# Patient Record
Sex: Male | Born: 1970 | Race: White | Hispanic: No | Smoking: Never smoker
Health system: Southern US, Community
[De-identification: ages and names within clinical notes are randomized; demographics above are authoritative.]

## PROBLEM LIST (undated history)

## (undated) DIAGNOSIS — M1990 Unspecified osteoarthritis, unspecified site: Secondary | ICD-10-CM

## (undated) DIAGNOSIS — E785 Hyperlipidemia, unspecified: Secondary | ICD-10-CM

## (undated) DIAGNOSIS — R519 Headache, unspecified: Secondary | ICD-10-CM

## (undated) DIAGNOSIS — J189 Pneumonia, unspecified organism: Secondary | ICD-10-CM

## (undated) HISTORY — PX: KNEE ARTHROSCOPY: SHX127

## (undated) HISTORY — PX: DG ANKLE LEFT COMPLETE (ARMC HX): HXRAD1441

## (undated) HISTORY — PX: APPENDECTOMY: SHX54

## (undated) HISTORY — PX: HERNIA REPAIR: SHX51

## (undated) HISTORY — PX: KNEE ARTHROPLASTY: SHX992

## (undated) HISTORY — DX: Hyperlipidemia, unspecified: E78.5

## (undated) HISTORY — PX: ANKLE SURGERY: SHX546

---

## 2015-09-26 HISTORY — PX: UMBILICAL HERNIA REPAIR: SUR1181

## 2022-01-09 ENCOUNTER — Emergency Department (HOSPITAL_COMMUNITY): Payer: Self-pay

## 2022-01-09 ENCOUNTER — Encounter (HOSPITAL_COMMUNITY): Payer: Self-pay

## 2022-01-09 ENCOUNTER — Other Ambulatory Visit: Payer: Self-pay

## 2022-01-09 ENCOUNTER — Emergency Department (HOSPITAL_COMMUNITY)
Admission: EM | Admit: 2022-01-09 | Discharge: 2022-01-09 | Discharge status: Against Medical Advice | Payer: Self-pay | Attending: Emergency Medicine | Admitting: Emergency Medicine

## 2022-01-09 DIAGNOSIS — Z5321 Procedure and treatment not carried out due to patient leaving prior to being seen by health care provider: Secondary | ICD-10-CM | POA: Insufficient documentation

## 2022-01-09 DIAGNOSIS — M545 Low back pain, unspecified: Secondary | ICD-10-CM | POA: Insufficient documentation

## 2022-01-09 NOTE — ED Triage Notes (Signed)
Patient states lower back pain with increasing pain over the past 2 weeks. Patient states walking make the pain severe. Patient states he was shoveling and hurt his back.  ?

## 2022-10-18 ENCOUNTER — Ambulatory Visit: Payer: Medicaid Other | Admitting: Nurse Practitioner

## 2022-11-08 DIAGNOSIS — U071 COVID-19: Secondary | ICD-10-CM | POA: Diagnosis not present

## 2022-11-24 DIAGNOSIS — M545 Low back pain, unspecified: Secondary | ICD-10-CM | POA: Diagnosis not present

## 2022-12-18 DIAGNOSIS — J209 Acute bronchitis, unspecified: Secondary | ICD-10-CM | POA: Diagnosis not present

## 2022-12-27 ENCOUNTER — Ambulatory Visit: Payer: Medicaid Other | Admitting: Family Medicine

## 2022-12-28 ENCOUNTER — Ambulatory Visit (INDEPENDENT_AMBULATORY_CARE_PROVIDER_SITE_OTHER): Payer: Medicaid Other | Admitting: Family Medicine

## 2022-12-28 ENCOUNTER — Encounter: Payer: Self-pay | Admitting: Family Medicine

## 2022-12-28 ENCOUNTER — Other Ambulatory Visit (INDEPENDENT_AMBULATORY_CARE_PROVIDER_SITE_OTHER): Payer: Medicaid Other

## 2022-12-28 VITALS — BP 136/78 | Ht 73.0 in | Wt 217.0 lb

## 2022-12-28 DIAGNOSIS — R079 Chest pain, unspecified: Secondary | ICD-10-CM | POA: Diagnosis not present

## 2022-12-28 DIAGNOSIS — R030 Elevated blood-pressure reading, without diagnosis of hypertension: Secondary | ICD-10-CM

## 2022-12-28 DIAGNOSIS — Z136 Encounter for screening for cardiovascular disorders: Secondary | ICD-10-CM

## 2022-12-28 DIAGNOSIS — Z833 Family history of diabetes mellitus: Secondary | ICD-10-CM

## 2022-12-28 DIAGNOSIS — Z1331 Encounter for screening for depression: Secondary | ICD-10-CM | POA: Diagnosis not present

## 2022-12-28 DIAGNOSIS — R0683 Snoring: Secondary | ICD-10-CM

## 2022-12-28 DIAGNOSIS — Z96652 Presence of left artificial knee joint: Secondary | ICD-10-CM | POA: Diagnosis not present

## 2022-12-28 NOTE — Progress Notes (Signed)
New Patient Office Visit  Subjective   Patient ID: Greg Mason, male    DOB: [birth year]  Age: 52 y.o. MRN: [id number]  CC:  Chief Complaint  Patient presents with   New Patient (Initial Visit)    Sharp stabbing pain left side of chest, 2 episodes Concern for high cholesterol Concern for high blood sugar   HPI Greg Mason presents to establish care Works in Architect, building houses. [employer]  Main concern today is chest pain  Intermittent that woke him up in the middle of the night  States that first episode was 3 months ago in January. Has only happened twice.  First episode woke up at 5 am, had to sit up and hold his chest.  Second time, watching TV in the evening. Describes it as sharp. Almost called EMS. Denies sweating, shortness of breath with it.  Endorses occasional acid reflux.  Denies edema, palpitations.   Patient is concerned for diabetes. Has family history diabetes and states his sister took his BG and it was >160 and now he is concerned.   Tobacco Abuse  Dips tobacco. Started a couple of years ago.   Has not had health maintenance in several years   Outpatient Encounter Medications as of 12/28/2022  Medication Sig   diphenhydrAMINE (BENADRYL) 25 mg capsule Take by mouth.   No facility-administered encounter medications on file as of 12/28/2022.   No past medical history on file.  No family history on file.  Social History   Socioeconomic History   Marital status: Divorced    Spouse name: Not on file   Number of children: Not on file   Years of education: Not on file   Highest education level: Not on file  Occupational History   Not on file  Tobacco Use   Smoking status: Not on file   Smokeless tobacco: Not on file  Substance and Sexual Activity   Alcohol use: Not on file   Drug use: Not on file   Sexual activity: Not on file  Other Topics Concern   Not on file  Social History Narrative   Not on file   Social  Determinants of Health   Financial Resource Strain: Not on file  Food Insecurity: Not on file  Transportation Needs: Not on file  Physical Activity: Not on file  Stress: Not on file  Social Connections: Not on file  Intimate Partner Violence: Not on file    ROS As per HPI   Objective       12/28/2022    3:21 PM 12/28/2022    3:00 PM 12/28/2022    2:52 PM  Vitals with BMI  Height   6\' 1"   Weight   217 lbs  BMI   [id number]  Systolic [id number]   Diastolic 78 80      Physical Exam Constitutional:      General: He is not in acute distress.    Appearance: Normal appearance. He is not ill-appearing, toxic-appearing or diaphoretic.  Cardiovascular:     Rate and Rhythm: Normal rate.     Pulses: Normal pulses.     Heart sounds: Normal heart sounds. No murmur heard.    No gallop.  Pulmonary:     Effort: Pulmonary effort is normal. No respiratory distress.     Breath sounds: Normal breath sounds. No stridor. No wheezing, rhonchi or rales.  Skin:    General: Skin is warm.     Capillary Refill: Capillary refill takes less than  2 seconds.  Neurological:     General: No focal deficit present.     Mental Status: He is alert and oriented to person, place, and time. Mental status is at baseline.     Motor: No weakness.  Psychiatric:        Mood and Affect: Mood normal.        Behavior: Behavior normal.        Thought Content: Thought content normal.        Judgment: Judgment normal.      12/28/2022    3:35 PM  Depression screen PHQ 2/9  Decreased Interest 0  Down, Depressed, Hopeless 0  PHQ - 2 Score 0  Altered sleeping 0  Tired, decreased energy 0  Change in appetite 0  Feeling bad or failure about yourself  0  Trouble concentrating 0  Moving slowly or fidgety/restless 0  Suicidal thoughts 0  PHQ-9 Score 0  Difficult doing work/chores Not difficult at all      12/28/2022    3:35 PM  GAD 7 : Generalized Anxiety Score  Nervous, Anxious, on Edge 0  Control/stop worrying 0   Worry too much - different things 0  Trouble relaxing 0  Restless 0  Easily annoyed or irritable 0  Afraid - awful might happen 0  Total GAD 7 Score 0  Anxiety Difficulty Not difficult at all   Assessment & Plan:  1. Chest pain, unspecified type EKG reviewed by myself in clinic today. NSR without significant abnormalities. Patient not having active chest pain. Labs as below. Will communicate results to patient once available. Differentials include anemia, OSA, thyroid dysfunction and hypertension  Zio monitor as below to monitor for arrhythmia  - EKG 12-Lead - LONG TERM MONITOR (3-14 DAYS); Future - CBC with Differential/Platelet - CMP14+EGFR - TSH - VITAMIN D 25 Hydroxy (Vit-D Deficiency, Fractures)  2. Snores Referral placed as below to evaluate for OSA. - Ambulatory referral to Sleep Studies  3. Family history of diabetes mellitus type II Labs as above. Will monitor fasting glucose to determine need for A1C.   4. Encounter for screening for cardiovascular disorders Labs as below. Will communicate results to patient once available.  Fasting - Lipid panel  5. Encounter for screening for depression Patient did not screen positive for depression or anxiety today on exam. Discussed with patient that chest pain can be associated with depression/anxiety.   6. History of total left knee replacement Referral placed as below per patient request.  - Ambulatory referral to Orthopedic Surgery  7. Elevated blood pressure reading Elevated BP today in office. Discussed with patient monitoring BP at home. Provided BP log to patient in clinic today. Instructed pt to take BP first thing in the morning after sitting for 5 minutes with feet flat on the floor, arm at heart level. Discussed with patient options for BP cuff at [facility], [facility], [facility]. Pt verbalized they were able to obtain BP cuff. Will review measurements with patient at follow up and determine plan for BP management.    The above assessment and management plan was discussed with the patient. The patient verbalized understanding of and has agreed to the management plan using shared-decision making. Patient is aware to call the clinic if they develop any new symptoms or if symptoms fail to improve or worsen. Patient is aware when to return to the clinic for a follow-up visit. Patient educated on when it is appropriate to go to the emergency department.   Alvie Heidelberg  Jeneen Montgomery, DNP-FNP [facility] [address] [place], [place] [address] [phone]

## 2022-12-29 LAB — CMP14+EGFR
ALT: 26 IU/L (ref 0–44)
AST: 24 IU/L (ref 0–40)
Albumin/Globulin Ratio: 1.6 (ref 1.2–2.2)
Albumin: 4.2 g/dL (ref 3.8–4.9)
Alkaline Phosphatase: 102 IU/L (ref 44–121)
BUN/Creatinine Ratio: 13 (ref 9–20)
BUN: 16 mg/dL (ref 6–24)
Bilirubin Total: 0.6 mg/dL (ref 0.0–1.2)
CO2: 26 mmol/L (ref 20–29)
Calcium: 9.9 mg/dL (ref 8.7–10.2)
Chloride: 102 mmol/L (ref 96–106)
Creatinine, Ser: 1.24 mg/dL (ref 0.76–1.27)
Globulin, Total: 2.7 g/dL (ref 1.5–4.5)
Glucose: 67 mg/dL — ABNORMAL LOW (ref 70–99)
Potassium: 4.6 mmol/L (ref 3.5–5.2)
Sodium: 141 mmol/L (ref 134–144)
Total Protein: 6.9 g/dL (ref 6.0–8.5)
eGFR: 70 mL/min/{1.73_m2} (ref >59)

## 2022-12-29 LAB — CBC WITH DIFFERENTIAL/PLATELET
Basophils Absolute: 0.1 10*3/uL (ref 0.0–0.2)
Basos: 1 %
EOS (ABSOLUTE): 0.1 10*3/uL (ref 0.0–0.4)
Eos: 1 %
Hematocrit: 46.2 % (ref 37.5–51.0)
Hemoglobin: 15.4 g/dL (ref 13.0–17.7)
Immature Grans (Abs): 0 10*3/uL (ref 0.0–0.1)
Immature Granulocytes: 0 %
Lymphocytes Absolute: 2.6 10*3/uL (ref 0.7–3.1)
Lymphs: 26 %
MCH: 29.1 pg (ref 26.6–33.0)
MCHC: 33.3 g/dL (ref 31.5–35.7)
MCV: 87 fL (ref 79–97)
Monocytes Absolute: 0.6 10*3/uL (ref 0.1–0.9)
Monocytes: 6 %
Neutrophils Absolute: 6.5 10*3/uL (ref 1.4–7.0)
Neutrophils: 66 %
Platelets: 311 10*3/uL (ref 150–450)
RBC: 5.29 x10E6/uL (ref 4.14–5.80)
RDW: 13 % (ref 11.6–15.4)
WBC: 9.8 10*3/uL (ref 3.4–10.8)

## 2022-12-29 LAB — LIPID PANEL
Chol/HDL Ratio: 3.4 ratio (ref 0.0–5.0)
Cholesterol, Total: 192 mg/dL (ref 100–199)
HDL: 56 mg/dL (ref >39)
LDL Chol Calc (NIH): 117 mg/dL — ABNORMAL HIGH (ref 0–99)
Triglycerides: 107 mg/dL (ref 0–149)
VLDL Cholesterol Cal: 19 mg/dL (ref 5–40)

## 2022-12-29 LAB — VITAMIN D 25 HYDROXY (VIT D DEFICIENCY, FRACTURES): Vit D, 25-Hydroxy: 46.9 ng/mL (ref 30.0–100.0)

## 2022-12-29 LAB — TSH: TSH: 2.04 u[IU]/mL (ref 0.450–4.500)

## 2023-01-15 ENCOUNTER — Ambulatory Visit: Payer: Medicaid Other | Admitting: Orthopedic Surgery

## 2023-01-19 NOTE — Addendum Note (Signed)
Addended by: Neale Burly on: 01/19/2023 01:51 PM   Modules accepted: Orders

## 2023-01-25 ENCOUNTER — Encounter: Payer: Medicaid Other | Admitting: Family Medicine

## 2023-02-21 ENCOUNTER — Encounter: Payer: Self-pay | Admitting: Internal Medicine

## 2023-02-21 ENCOUNTER — Ambulatory Visit: Payer: Medicaid Other | Attending: Internal Medicine | Admitting: Internal Medicine

## 2023-02-21 VITALS — BP 144/88 | HR 76 | Ht 73.0 in | Wt 223.2 lb

## 2023-02-21 DIAGNOSIS — R0789 Other chest pain: Secondary | ICD-10-CM | POA: Diagnosis not present

## 2023-02-21 NOTE — Patient Instructions (Addendum)
Medication Instructions:   Your physician recommends that you continue on your current medications as directed. Please refer to the Current Medication list given to you today.  Labwork:  none  Testing/Procedures:  none  Follow-Up:  Your physician recommends that you schedule a follow-up appointment in: as needed.  Any Other Special Instructions Will Be Listed Below (If Applicable).  If you need a refill on your cardiac medications before your next appointment, please call your pharmacy. 

## 2023-02-21 NOTE — Progress Notes (Signed)
Cardiology Office Note  Date: 02/21/2023   ID: Greg Mason, DOB 07/09/1971, MRN [id number]  PCP:  Arrie Senate, FNP  Cardiologist:  Marjo Bicker, MD Electrophysiologist:  None   Reason for Office Visit: Chest pain evaluation at the request of [name], FNP   History of Present Illness: Greg Mason is a 52 y.o. male with no PMH was referred to cardiology clinic for evaluation of chest pain.  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 He walks long distances at work and also lifts weights for his side business. Has no symptoms of angina during these extreme strenuous activities. He had 2 episodes of chest pain, first episode occurred in the middle of the night waking him up from sleep. Lasted for 1 minute. He tried to hold his hands close to his chest after which the pain resolved completely. He had a recurrent episode while he was watching TV and lasted for 1 to 2 minutes. These 2 episodes occurred at rest.  Patient eats a lot of spicy foods and does not recall if he ate any spicy food the night before the first episode of chest pain. No family history of premature CAD. Denies DOE, dizziness, syncope, palpitations and leg swelling.    Past Medical History:  Diagnosis Date   Hyperlipidemia     Past Surgical History:  Procedure Laterality Date   APPENDECTOMY     DG ANKLE LEFT COMPLETE ([facility] HX)     HERNIA REPAIR     KNEE ARTHROSCOPY      Current Outpatient Medications  Medication Sig Dispense Refill   diphenhydrAMINE (BENADRYL) 25 mg capsule Take by mouth.     No current facility-administered medications for this visit.   Allergies:  Hydrocodone   Social History: The patient  reports that he has never smoked. His smokeless tobacco use includes snuff. He reports that he does not drink alcohol and does not use drugs.   Family History: The patient's family history includes Diabetes in his mother; Seizures in his sister.    ROS:  Please see the history of present illness. Otherwise, complete review of systems is positive for none  All other systems are reviewed and negative.   Physical Exam: VS:  BP (!) 144/88   Pulse 76   Ht 6\' 1"  (1.854 m)   Wt 223 lb 3.2 oz (101.2 kg)   SpO2 98%   BMI 29.45 kg/m , BMI Body mass index is 29.45 kg/m.  Wt Readings from Last 3 Encounters:  02/21/23 223 lb 3.2 oz (101.2 kg)  12/28/22 217 lb (98.4 kg)  01/09/22 220 lb (99.8 kg)    General: Patient appears comfortable at rest. HEENT: Conjunctiva and lids normal, oropharynx clear with moist mucosa. Neck: Supple, no elevated JVP or carotid bruits, no thyromegaly. Lungs: Clear to auscultation, nonlabored breathing at rest. Cardiac: Regular rate and rhythm, no S3 or significant systolic murmur, no pericardial rub. Abdomen: Soft, nontender, no hepatomegaly, bowel sounds present, no guarding or rebound. Extremities: No pitting edema, distal pulses 2+. Skin: Warm and dry. Musculoskeletal: No kyphosis. Neuropsychiatric: Alert and oriented x3, affect grossly appropriate.  Recent Labwork: 12/28/2022: ALT 26; AST 24; BUN 16; Creatinine, Ser 1.24; Hemoglobin 15.4; Platelets 311; Potassium 4.6; Sodium 141; TSH 2.040     Component Value Date/Time   CHOL 192 12/28/2022 1603   TRIG 107 12/28/2022 1603   HDL 56 12/28/2022 1603   CHOLHDL 3.4 12/28/2022 1603   LDLCALC 117 (H) 12/28/2022 1603  Assessment and Plan:  Patient is a 52 year old M with no PMH was referred to cardiology clinic for evaluation chest pain.  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 He denies any angina during these strenuous activities. His chest pain episodes occured at rest, at night woke him up from sleep and while watching TV, lasting for 1 to 2 minutes.  He eats a lot of spicy foods and probably might have caused the chest pain.  No risk factors. No indication of further cardiac testing at this  time.   I have spent a total of 30 minutes with patient reviewing chart, EKGs, labs and examining patient as well as establishing an assessment and plan that was discussed with the patient.  > 50% of time was spent in direct patient care.    Medication Adjustments/Labs and Tests Ordered: Current medicines are reviewed at length with the patient today.  Concerns regarding medicines are outlined above.   Tests Ordered: No orders of the defined types were placed in this encounter.   Medication Changes: No orders of the defined types were placed in this encounter.   Disposition:  Follow up prn  Signed Devonn Giampietro Verne Spurr, MD, 02/21/2023 2:51 PM    [facility] [address] [place], [place], [place] [address]

## 2023-02-22 ENCOUNTER — Encounter: Payer: Self-pay | Admitting: Orthopedic Surgery

## 2023-02-22 ENCOUNTER — Ambulatory Visit (INDEPENDENT_AMBULATORY_CARE_PROVIDER_SITE_OTHER): Payer: Medicaid Other | Admitting: Orthopedic Surgery

## 2023-02-22 ENCOUNTER — Other Ambulatory Visit (INDEPENDENT_AMBULATORY_CARE_PROVIDER_SITE_OTHER): Payer: Medicaid Other

## 2023-02-22 VITALS — BP 134/75 | HR 77 | Ht 73.0 in | Wt 222.0 lb

## 2023-02-22 DIAGNOSIS — Z96652 Presence of left artificial knee joint: Secondary | ICD-10-CM

## 2023-02-22 DIAGNOSIS — M25562 Pain in left knee: Secondary | ICD-10-CM

## 2023-02-22 DIAGNOSIS — G8929 Other chronic pain: Secondary | ICD-10-CM | POA: Diagnosis not present

## 2023-02-22 MED ORDER — MELOXICAM 7.5 MG PO TABS: 7.5000 mg | ORAL_TABLET | Freq: Every day | ORAL | 5 refills | Status: DC

## 2023-02-22 NOTE — Progress Notes (Signed)
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  He says that he feels swelling in the knee and it is hard to bend it at times  He is only taken aspirin for his pain  He says the knee was functioning well until that time  He describes his pain as over the front of the knee  Review of systems No fever and no chest pain no shortness of breath no erythema around the joint no drainage from the joint  Past Medical History:  Diagnosis Date   Hyperlipidemia    Past Surgical History:  Procedure Laterality Date   APPENDECTOMY     DG ANKLE LEFT COMPLETE ([facility] HX)     HERNIA REPAIR     KNEE ARTHROSCOPY     Current Outpatient Medications  Medication Instructions   diphenhydrAMINE (BENADRYL) 25 mg capsule Oral   meloxicam (MOBIC) 7.5 mg, Oral, Daily    BP 134/75   Pulse 77   Ht 6\' 1"  (1.854 m)   Wt 222 lb (100.7 kg)   BMI 29.29 kg/m   Physical Exam Vitals and nursing note reviewed.  Constitutional:      Appearance: Normal appearance.  HENT:     Head: Normocephalic and atraumatic.  Eyes:     General: No scleral icterus.       Right eye: No discharge.        Left eye: No discharge.     Extraocular Movements: Extraocular movements intact.     Conjunctiva/sclera: Conjunctivae normal.     Pupils: Pupils are equal, round, and reactive to light.  Cardiovascular:     Rate and Rhythm: Normal rate.     Pulses: Normal pulses.  Skin:    General: Skin is warm and dry.     Capillary Refill: Capillary refill takes less  than 2 seconds.  Neurological:     General: No focal deficit present.     Mental Status: He is alert and oriented to person, place, and time.  Psychiatric:        Mood and Affect: Mood normal.        Behavior: Behavior normal.        Thought Content: Thought content normal.        Judgment: Judgment normal.    Looking at the left knee he comes to full extension his flexion is approximately 120 degrees he has just a little bit of trace anterior drawer at 90 degrees but it is a firm endpoint and no laxity.  He seems to have more laxity with valgus and varus stress but nothing that I would consider unstable  His pain is under the medial side of his patella he has some mild lateral patellar pain no tenderness on the proximal tibia medially or laterally and no femoral tenderness.  No joint effusion.  Full extension and good flexion again are noted with good quadriceps power  Imaging was obtained  the alignment is perfect on the AP and lateral views, I cannot tell regarding the offset because the x-ray is oblique there may be some evidence that the posterior flange is a little long but that is being hypercritical  On the axial view of the patella there appears to be noted.  Slight tilt laterally but at this ankle with flexion this may be just positional  Assessment and plan  52 year old male 2 years postop from a left total knee starting to have some pain and swelling which I believe is secondary to overactivity and placing too much stress on the implant.  I think his activity level is high indicating a highly functioning total knee actually.  I would like to try some anti-inflammatories to see if this is just synovial irritation.  He does not feel unstable.  This does not appear to be a flexion gap issue.  This may be a patellofemoral issue which is the most common complication after total knee arthroplasty.  I do not think it needs to be revised I think he is functioning very highly at this  point and it would be imprudent to disturb the milieu of this implant  Meds ordered this encounter  Medications   meloxicam (MOBIC) 7.5 MG tablet    Sig: Take 1 tablet (7.5 mg total) by mouth daily.    Dispense:  30 tablet    Refill:  5    Encounter Diagnoses  Name Primary?   Status post total left knee replacement    Acute pain of left knee Yes

## 2023-02-28 ENCOUNTER — Other Ambulatory Visit (HOSPITAL_COMMUNITY)
Admission: RE | Admit: 2023-02-28 | Discharge: 2023-02-28 | Disposition: A | Discharge status: Home / Self Care | Payer: Medicaid Other | Source: Ambulatory Visit | Attending: Family Medicine | Admitting: Family Medicine

## 2023-02-28 ENCOUNTER — Ambulatory Visit (INDEPENDENT_AMBULATORY_CARE_PROVIDER_SITE_OTHER): Payer: Medicaid Other | Admitting: Family Medicine

## 2023-02-28 ENCOUNTER — Encounter: Payer: Self-pay | Admitting: Family Medicine

## 2023-02-28 VITALS — BP 125/74 | HR 74 | Temp 97.7°F | Ht 73.0 in | Wt 222.0 lb

## 2023-02-28 DIAGNOSIS — Z0001 Encounter for general adult medical examination with abnormal findings: Secondary | ICD-10-CM

## 2023-02-28 DIAGNOSIS — Z0000 Encounter for general adult medical examination without abnormal findings: Secondary | ICD-10-CM

## 2023-02-28 DIAGNOSIS — Z791 Long term (current) use of non-steroidal anti-inflammatories (NSAID): Secondary | ICD-10-CM | POA: Diagnosis not present

## 2023-02-28 DIAGNOSIS — Z113 Encounter for screening for infections with a predominantly sexual mode of transmission: Secondary | ICD-10-CM

## 2023-02-28 DIAGNOSIS — M2550 Pain in unspecified joint: Secondary | ICD-10-CM | POA: Diagnosis not present

## 2023-02-28 DIAGNOSIS — Z1211 Encounter for screening for malignant neoplasm of colon: Secondary | ICD-10-CM

## 2023-02-28 MED ORDER — CELECOXIB 200 MG PO CAPS: 200.0000 mg | ORAL_CAPSULE | Freq: Every day | ORAL | 0 refills | Status: DC

## 2023-02-28 MED ORDER — OMEPRAZOLE 20 MG PO CPDR: 20.0000 mg | DELAYED_RELEASE_CAPSULE | Freq: Every day | ORAL | 3 refills | Status: DC

## 2023-02-28 NOTE — Progress Notes (Signed)
Greg Mason is a 52 y.o. male presents to office today for annual physical exam examination.    Concerns today include: 1. Followed with Cardiology. Reports that they did not recommend follow up. States that he is no longer having chest pain. He admits to continued intake of red bull.   States that he takes benadryl daily at night because of itching at night. Has not figured out the cause. Has tried to change his soaps and detergents in the past, but cannot find a trigger. Does not break out in a rash.   Continues to have knee pain. States that meloxicam did not work and he would like to try something else  Occupation: Data processing manager for [employer]  Marital status: male  Diet: "not good" fast food, processed pizza. States that he is trying to eat things more fiber, lettuce on sandwiches  Exercise: with work, walking, lifting weights  Substance use: dip, 1/2 can per day  Last eye exam: completed recently with employer.  Last dental exam: needs dentist  Last colonoscopy: due  Contraceptive: declines Vasectomy  PSA: no significant history of Prostate CA, gets up once to urinate nightly  Refills needed today: none, meloxicam did not help  Other specialists seen: cardiology, ortho  Dermatology exam: not completed  Fasting today:  no  Immunizations needed: Flu Vaccine: no  Tdap Vaccine: yes  - every 18yrs - (<3 lifetime doses or unknown): all wounds -- look up need for Tetanus IG - (>=3 lifetime doses): clean/minor wound if >18yrs from previous; all other wounds if >80yrs from previous Zoster Vaccine: yes (those >50yo, once) Pneumonia Vaccine: no (those w/ risk factors) - (<59yr) Both: Immunocompromised, cochlear implant, CSF leak, asplenic, sickle cell, Chronic Renal Failure - (<69yr) PPSV-23 only: Heart dz, lung disease, DM, tobacco abuse, alcoholism, cirrhosis/liver disease. - (>64yr): PPSV13 then PPSV23 in 6-12mths;  - (>80yr): repeat PPSV23 once if pt received  prior to 52yo and 34yrs have passed  Past Medical History:  Diagnosis Date   Hyperlipidemia    Social History   Socioeconomic History   Marital status: Divorced    Spouse name: Not on file   Number of children: Not on file   Years of education: Not on file   Highest education level: Not on file  Occupational History   Not on file  Tobacco Use   Smoking status: Never   Smokeless tobacco: Current    Types: Snuff  Vaping Use   Vaping Use: Never used  Substance and Sexual Activity   Alcohol use: Never   Drug use: Never   Sexual activity: Not on file  Other Topics Concern   Not on file  Social History Narrative   Not on file   Social Determinants of Health   Financial Resource Strain: Not on file  Food Insecurity: Not on file  Transportation Needs: Not on file  Physical Activity: Not on file  Stress: Not on file  Social Connections: Not on file  Intimate Partner Violence: Not on file   Past Surgical History:  Procedure Laterality Date   APPENDECTOMY     DG ANKLE LEFT COMPLETE ([facility] HX)     HERNIA REPAIR     KNEE ARTHROSCOPY     Family History  Problem Relation Age of Onset   Diabetes Mother    Seizures Sister     Current Outpatient Medications:    diphenhydrAMINE (BENADRYL) 25 mg capsule, Take by mouth., Disp: , Rfl:    meloxicam (MOBIC) 7.5 MG  tablet, Take 1 tablet (7.5 mg total) by mouth daily. (Patient not taking: Reported on 02/28/2023), Disp: 30 tablet, Rfl: 5  Allergies  Allergen Reactions   Hydrocodone Itching    ROS: Review of Systems Review of Systems  Musculoskeletal:  Positive for joint pain.    Physical exam    02/28/2023    3:33 PM 02/22/2023    4:16 PM 02/21/2023    2:22 PM  Vitals with BMI  Height 6\' 1"  6\' 1"  6\' 1"   Weight 222 lbs 222 lbs 223 lbs 3 oz  BMI 29.3 29.3 29.45  Systolic 125 134 409  Diastolic 74 75 88  Pulse 74 77 76    Physical Exam Constitutional:      Appearance: Normal appearance. He is normal weight.  HENT:      Right Ear: A middle ear effusion is present.     Left Ear: A middle ear effusion is present.     Nose: Nose normal.     Mouth/Throat:     Mouth: Mucous membranes are moist.     Pharynx: Oropharynx is clear. No oropharyngeal exudate or posterior oropharyngeal erythema.  Eyes:     Conjunctiva/sclera: Conjunctivae normal.     Pupils: Pupils are equal, round, and reactive to light.  Cardiovascular:     Rate and Rhythm: Normal rate and regular rhythm.     Pulses: Normal pulses.     Heart sounds: Normal heart sounds. No murmur heard. Pulmonary:     Effort: Pulmonary effort is normal. No respiratory distress.     Breath sounds: Normal breath sounds. No stridor. No wheezing, rhonchi or rales.  Abdominal:     General: Abdomen is flat. Bowel sounds are normal. There is no distension.     Palpations: Abdomen is soft. There is no mass.     Tenderness: There is no abdominal tenderness. There is no guarding or rebound.     Hernia: No hernia is present.  Genitourinary:    Comments: Deferred  Musculoskeletal:        General: Normal range of motion.     Cervical back: Normal range of motion.  Skin:    General: Skin is warm.     Capillary Refill: Capillary refill takes less than 2 seconds.  Neurological:     General: No focal deficit present.     Mental Status: He is alert and oriented to person, place, and time. Mental status is at baseline.     Cranial Nerves: No cranial nerve deficit.     Motor: No weakness.     Gait: Gait normal.  Psychiatric:        Mood and Affect: Mood normal.        Behavior: Behavior normal.        Thought Content: Thought content normal.        Judgment: Judgment normal.     Assessment/ Plan: Greg Mason here for annual physical exam.  1. Routine general medical examination at a health care facility Discussed with patient to continue healthy lifestyle choices, including diet (rich in fruits, vegetables, and lean proteins, and low in salt and simple  carbohydrates) and exercise (at least 30 minutes of moderate physical activity daily). Limit beverages high is sugar. Recommended at least 80-100 oz of water daily.   2. NSAID long-term use - omeprazole (PRILOSEC) 20 MG capsule; Take 1 capsule (20 mg total) by mouth daily.  Dispense: 30 capsule; Refill: 3  3. Routine screening for STI (sexually transmitted infection) Labs  as below. Will communicate results to patient once available.  - RPR - Urine cytology ancillary only - HepB+HepC+HIV Panel  4. Screening for colon cancer - Ambulatory referral to Gastroenterology  5. Arthralgia, unspecified joint Labs as below. Will communicate results to patient once available.  Pain is not controlled. Will trial celebrex as below. Discussed with patient to stop meloxicam. Instructed patient to follow up with ortho.  - Uric Acid - celecoxib (CELEBREX) 200 MG capsule; Take 1 capsule (200 mg total) by mouth daily.  Dispense: 30 capsule; Refill: 0  Patient to follow up in 1 year for annual exam or sooner if needed.  The above assessment and management plan was discussed with the patient. The patient verbalized understanding of and has agreed to the management plan. Patient is aware to call the clinic if symptoms persist or worsen. Patient is aware when to return to the clinic for a follow-up visit. Patient educated on when it is appropriate to go to the emergency department.   Neale Burly, DNP-FNP [facility] [address] [place], [place] [address] [phone]

## 2023-02-28 NOTE — Patient Instructions (Addendum)
Dental list          reviewed 03.08.24 Many of these dentists accept Medicaid.  The list is for your convenience in choosing your child's dentist. Estos dentistas aceptan Medicaid.  La lista es para su [place] y es una cortesa.     [facility]     [phone] [address]  [address] [place] [address] Se habla espaol From 56 to 52 years old Parent may go with child Vinson Moselle DDS     [phone] [address] [place]  [address] Se habla espaol From 58 to 69 years old Parent may NOT go with child  [facility]    [phone] [address] [place] [address] No se habla espaol From birth Parent may not go with child  [facility]     [phone] [address] [place] [address] Se habla espaol From 60 to 80 years old Parent may NOT go with child  Winfield Rast DDS     [phone] [facility]      [address]  [place] [address] No se habla espaol From teeth coming in Parent may go with child  [facility]     [phone] [address] [place]. [place] [address] Requires certification. Call for information. Requiere certificacin. Llame para informacin. Algunos dias se habla espaol  From birth to 20 years Parent possibly goes with child  Bradd Canary DDS     [phone] [address]  [address] [place] [address] Se habla espaol From 18 months to 18 years  Parent may go with child  J. Carthage DDS    [phone] Garlon Hatchet DDS [address] [place] [address] Se habla espaol From 74 year old Parent may go with child  Melynda Ripple DDS    [phone] [address] [place] [address] Se habla espaol  From 72 months old Parent may go with child Dorian Pod DDS    [phone] [address] [place] [address] Se habla espaol From 39 to 55 years old Parent may go with child      Health  Maintenance, Male Adopting a healthy lifestyle and getting preventive care are important in promoting health and wellness. Ask your health care provider about: The right schedule for you to have regular tests and exams. Things you can do on your own to prevent diseases and keep yourself healthy. What should I know about diet, weight, and exercise? Eat a healthy diet  Eat a diet that includes plenty of vegetables, fruits, low-fat dairy products, and lean protein. Do not eat a lot of foods that are high in solid fats, added sugars, or sodium. Maintain a healthy weight Body mass index (BMI) is a measurement that can be used to identify possible weight problems. It estimates body fat based on height and weight. Your health care provider can help determine your BMI and help you achieve or maintain a healthy weight. Get regular exercise Get regular exercise. This is one of the most important things you can do for your health. Most adults should: Exercise for at least 150 minutes each week. The exercise should increase your heart rate and make you sweat (moderate-intensity exercise). Do strengthening exercises at least twice a week. This is in addition to the moderate-intensity exercise. Spend less time sitting. Even light physical activity can be beneficial. Watch cholesterol and blood lipids Have your blood tested for lipids and cholesterol  at 52 years of age, then have this test every 5 years. You may need to have your cholesterol levels checked more often if: Your lipid or cholesterol levels are high. You are older than 52 years of age. You are at high risk for heart disease. What should I know about cancer screening? Many types of cancers can be detected early and may often be prevented. Depending on your health history and family history, you may need to have cancer screening at various ages. This may include screening for: Colorectal cancer. Prostate cancer. Skin cancer. Lung cancer. What  should I know about heart disease, diabetes, and high blood pressure? Blood pressure and heart disease High blood pressure causes heart disease and increases the risk of stroke. This is more likely to develop in people who have high blood pressure readings or are overweight. Talk with your health care provider about your target blood pressure readings. Have your blood pressure checked: Every 3-5 years if you are 34-67 years of age. Every year if you are 75 years old or older. If you are between the ages of 36 and 27 and are a current or former smoker, ask your health care provider if you should have a one-time screening for abdominal aortic aneurysm (AAA). Diabetes Have regular diabetes screenings. This checks your fasting blood sugar level. Have the screening done: Once every three years after age 101 if you are at a normal weight and have a low risk for diabetes. More often and at a younger age if you are overweight or have a high risk for diabetes. What should I know about preventing infection? Hepatitis B If you have a higher risk for hepatitis B, you should be screened for this virus. Talk with your health care provider to find out if you are at risk for hepatitis B infection. Hepatitis C Blood testing is recommended for: Everyone born from [birth year] through 1965. Anyone with known risk factors for hepatitis C. Sexually transmitted infections (STIs) You should be screened each year for STIs, including gonorrhea and chlamydia, if: You are sexually active and are younger than 52 years of age. You are older than 52 years of age and your health care provider tells you that you are at risk for this type of infection. Your sexual activity has changed since you were last screened, and you are at increased risk for chlamydia or gonorrhea. Ask your health care provider if you are at risk. Ask your health care provider about whether you are at high risk for HIV. Your health care provider may recommend a  prescription medicine to help prevent HIV infection. If you choose to take medicine to prevent HIV, you should first get tested for HIV. You should then be tested every 3 months for as long as you are taking the medicine. Follow these instructions at home: Alcohol use Do not drink alcohol if your health care provider tells you not to drink. If you drink alcohol: Limit how much you have to 0-2 drinks a day. Know how much alcohol is in your drink. In the [place] one drink equals one 12 oz bottle of beer (355 mL), one 5 oz glass of wine (148 mL), or one 1 oz glass of hard liquor (44 mL). Lifestyle Do not use any products that contain nicotine or tobacco. These products include cigarettes, chewing tobacco, and vaping devices, such as e-cigarettes. If you need help quitting, ask your health care provider. Do not use street drugs. Do not share needles. Ask your  health care provider for help if you need support or information about quitting drugs. General instructions Schedule regular health, dental, and eye exams. Stay current with your vaccines. Tell your health care provider if: You often feel depressed. You have ever been abused or do not feel safe at home. Summary Adopting a healthy lifestyle and getting preventive care are important in promoting health and wellness. Follow your health care provider's instructions about healthy diet, exercising, and getting tested or screened for diseases. Follow your health care provider's instructions on monitoring your cholesterol and blood pressure. This information is not intended to replace advice given to you by your health care provider. Make sure you discuss any questions you have with your health care provider. Document Revised: 01/31/2021 Document Reviewed: 01/31/2021 Elsevier Patient Education  2024 [employer].

## 2023-03-01 ENCOUNTER — Encounter: Payer: Self-pay | Admitting: *Deleted

## 2023-03-01 LAB — HEPB+HEPC+HIV PANEL
HIV Screen 4th Generation wRfx: NONREACTIVE
Hep B C IgM: NEGATIVE
Hep B Core Total Ab: NEGATIVE
Hep B E Ab: NONREACTIVE
Hep B E Ag: NEGATIVE
Hep B Surface Ab, Qual: NONREACTIVE
Hep C Virus Ab: NONREACTIVE
Hepatitis B Surface Ag: NEGATIVE

## 2023-03-01 LAB — URIC ACID: Uric Acid: 6.3 mg/dL (ref 3.8–8.4)

## 2023-03-01 LAB — RPR: RPR Ser Ql: NONREACTIVE

## 2023-03-02 LAB — URINE CYTOLOGY ANCILLARY ONLY
Chlamydia: NEGATIVE
Comment: NEGATIVE
Comment: NEGATIVE
Comment: NORMAL
Neisseria Gonorrhea: NEGATIVE
Trichomonas: NEGATIVE

## 2023-03-02 NOTE — Progress Notes (Signed)
Negative STI screening. Patient is not immune to Hep B if he would like to schedule with Triage RN they can make an appt to receive vaccinations. Uric acid level is within normal range, does not completely rule out diagnosis of gout - follow up if not improving

## 2023-03-05 ENCOUNTER — Emergency Department (HOSPITAL_COMMUNITY)
Admission: EM | Admit: 2023-03-05 | Discharge: 2023-03-06 | Disposition: A | Discharge status: Home / Self Care | Payer: Medicaid Other | Attending: Emergency Medicine | Admitting: Emergency Medicine

## 2023-03-05 ENCOUNTER — Other Ambulatory Visit: Payer: Self-pay

## 2023-03-05 ENCOUNTER — Emergency Department (HOSPITAL_COMMUNITY): Payer: Medicaid Other

## 2023-03-05 DIAGNOSIS — M7989 Other specified soft tissue disorders: Secondary | ICD-10-CM | POA: Diagnosis present

## 2023-03-05 DIAGNOSIS — L03113 Cellulitis of right upper limb: Secondary | ICD-10-CM | POA: Insufficient documentation

## 2023-03-05 DIAGNOSIS — M25521 Pain in right elbow: Secondary | ICD-10-CM | POA: Diagnosis not present

## 2023-03-05 DIAGNOSIS — L02413 Cutaneous abscess of right upper limb: Secondary | ICD-10-CM | POA: Diagnosis not present

## 2023-03-05 LAB — CBC WITH DIFFERENTIAL/PLATELET
Abs Immature Granulocytes: 0.04 10*3/uL (ref 0.00–0.07)
Basophils Absolute: 0.1 10*3/uL (ref 0.0–0.1)
Basophils Relative: 0 %
Eosinophils Absolute: 0.2 10*3/uL (ref 0.0–0.5)
Eosinophils Relative: 1 %
HCT: 39 % (ref 39.0–52.0)
Hemoglobin: 13 g/dL (ref 13.0–17.0)
Immature Granulocytes: 0 %
Lymphocytes Relative: 12 %
Lymphs Abs: 1.4 10*3/uL (ref 0.7–4.0)
MCH: 30 pg (ref 26.0–34.0)
MCHC: 33.3 g/dL (ref 30.0–36.0)
MCV: 89.9 fL (ref 80.0–100.0)
Monocytes Absolute: 0.7 10*3/uL (ref 0.1–1.0)
Monocytes Relative: 6 %
Neutro Abs: 9.2 10*3/uL — ABNORMAL HIGH (ref 1.7–7.7)
Neutrophils Relative %: 81 %
Platelets: 223 10*3/uL (ref 150–400)
RBC: 4.34 MIL/uL (ref 4.22–5.81)
RDW: 12.5 % (ref 11.5–15.5)
WBC: 11.5 10*3/uL — ABNORMAL HIGH (ref 4.0–10.5)
nRBC: 0 % (ref 0.0–0.2)

## 2023-03-05 LAB — URINALYSIS, ROUTINE W REFLEX MICROSCOPIC
Bilirubin Urine: NEGATIVE
Glucose, UA: NEGATIVE mg/dL
Ketones, ur: NEGATIVE mg/dL
Leukocytes,Ua: NEGATIVE
Nitrite: NEGATIVE
Protein, ur: NEGATIVE mg/dL
Specific Gravity, Urine: 1.024 (ref 1.005–1.030)
pH: 5 (ref 5.0–8.0)

## 2023-03-05 LAB — COMPREHENSIVE METABOLIC PANEL
ALT: 27 U/L (ref 0–44)
AST: 25 U/L (ref 15–41)
Albumin: 3.6 g/dL (ref 3.5–5.0)
Alkaline Phosphatase: 75 U/L (ref 38–126)
Anion gap: 6 (ref 5–15)
BUN: 19 mg/dL (ref 6–20)
CO2: 25 mmol/L (ref 22–32)
Calcium: 8.5 mg/dL — ABNORMAL LOW (ref 8.9–10.3)
Chloride: 105 mmol/L (ref 98–111)
Creatinine, Ser: 1.15 mg/dL (ref 0.61–1.24)
GFR, Estimated: >60 mL/min (ref >60)
Glucose, Bld: 135 mg/dL — ABNORMAL HIGH (ref 70–99)
Potassium: 3.9 mmol/L (ref 3.5–5.1)
Sodium: 136 mmol/L (ref 135–145)
Total Bilirubin: 0.5 mg/dL (ref 0.3–1.2)
Total Protein: 6.7 g/dL (ref 6.5–8.1)

## 2023-03-05 LAB — LACTIC ACID, PLASMA
Lactic Acid, Venous: 1.2 mmol/L (ref 0.5–1.9)
Lactic Acid, Venous: 1 mmol/L (ref 0.5–1.9)

## 2023-03-05 MED ORDER — LIDOCAINE HCL (PF) 1 % IJ SOLN
5.0000 mL | Freq: Once | INTRAMUSCULAR | Status: AC
Start: 2023-03-05 — End: 2023-03-05
  Administered 2023-03-05: 5 mL via INTRADERMAL
  Filled 2023-03-05: qty 5

## 2023-03-05 MED ORDER — MORPHINE SULFATE (PF) 4 MG/ML IV SOLN
4.0000 mg | Freq: Once | INTRAVENOUS | Status: AC
  Administered 2023-03-05: 4 mg via INTRAVENOUS
  Filled 2023-03-05: qty 1

## 2023-03-05 MED ORDER — CLINDAMYCIN PHOSPHATE 600 MG/50ML IV SOLN
600.0000 mg | Freq: Once | INTRAVENOUS | Status: AC
  Administered 2023-03-05: 600 mg via INTRAVENOUS
  Filled 2023-03-05: qty 50

## 2023-03-05 NOTE — ED Triage Notes (Signed)
Pt with elbow swelling to right elbow since end of last week. Swelling and redness noted with heat surrounding.  Pt was sent here from [facility] for possible joint infection.  Pt unable to full rotate arm at the elbow.  Has had fever and chills this weekend.

## 2023-03-06 MED ORDER — CLINDAMYCIN HCL 300 MG PO CAPS: 300.0000 mg | ORAL_CAPSULE | Freq: Four times a day (QID) | ORAL | 0 refills | Status: DC

## 2023-03-06 MED ORDER — MORPHINE SULFATE (PF) 4 MG/ML IV SOLN
4.0000 mg | Freq: Once | INTRAVENOUS | Status: AC
  Administered 2023-03-06: 4 mg via INTRAVENOUS
  Filled 2023-03-06: qty 1

## 2023-03-06 MED ORDER — OXYCODONE-ACETAMINOPHEN 5-325 MG PO TABS
1.0000 | ORAL_TABLET | Freq: Four times a day (QID) | ORAL | 0 refills | Status: DC | PRN
Start: 2023-03-06 — End: 2023-03-22

## 2023-03-06 NOTE — ED Provider Notes (Signed)
[place] EMERGENCY DEPARTMENT AT [facility] Provider Note   CSN: [id number] Arrival date & time: 03/05/23  1859     History  Chief Complaint  Patient presents with   Joint Swelling    Greg Mason is a 52 y.o. male.  Patient is a 52 year old male with history of GERD.  Patient presenting today with complaints of pain and swelling to his right arm.  He was seen at urgent care, then referred here for possible infection in his elbow joint.  Patient describes a 3-day history of pain, redness, and swelling to the lateral aspect of the right elbow.  He denies any fevers or chills.  The history is provided by the patient.       Home Medications Prior to Admission medications   Medication Sig Start Date End Date Taking? Authorizing Provider  celecoxib (CELEBREX) 200 MG capsule Take 1 capsule (200 mg total) by mouth daily. 02/28/23 03/30/23  Arrie Senate, FNP  diphenhydrAMINE (BENADRYL) 25 mg capsule Take by mouth.    [provider]  omeprazole (PRILOSEC) 20 MG capsule Take 1 capsule (20 mg total) by mouth daily. 02/28/23   [name], Aleen Campi, FNP      Allergies    Hydrocodone    Review of Systems   Review of Systems  All other systems reviewed and are negative.   Physical Exam Updated Vital Signs BP (!) 143/80 (BP Location: Left Arm)   Pulse 90   Temp 98.6 F (37 C) (Oral)   Resp (!) 22   SpO2 100%  Physical Exam Vitals and nursing note reviewed.  HENT:     Head: Normocephalic.  Pulmonary:     Effort: Pulmonary effort is normal.  Musculoskeletal:     Comments: The lateral aspect of the right elbow shows erythema, warmth, and induration.  He has good range of motion of the elbow, but with some discomfort.  Ulnar and radial pulses are easily palpable and motor and sensation are intact throughout the entire hand.  Skin:    General: Skin is warm and dry.  Neurological:     Mental Status: He is alert and oriented to person, place, and  time.     ED Results / Procedures / Treatments   Labs (all labs ordered are listed, but only abnormal results are displayed) Labs Reviewed  COMPREHENSIVE METABOLIC PANEL - Abnormal; Notable for the following components:      Result Value   Glucose, Bld 135 (*)    Calcium 8.5 (*)    All other components within normal limits  CBC WITH DIFFERENTIAL/PLATELET - Abnormal; Notable for the following components:   WBC 11.5 (*)    Neutro Abs 9.2 (*)    All other components within normal limits  URINALYSIS, ROUTINE W REFLEX MICROSCOPIC - Abnormal; Notable for the following components:   Hgb urine dipstick MODERATE (*)    Bacteria, UA RARE (*)    All other components within normal limits  LACTIC ACID, PLASMA  LACTIC ACID, PLASMA    EKG None  Radiology DG Elbow 2 Views Right  Result Date: 03/05/2023 CLINICAL DATA:  Infection. Joint pain, swelling, and redness since Friday. No known injury. EXAM: RIGHT ELBOW - 2 VIEW COMPARISON:  None Available. FINDINGS: No evidence of acute fracture or dislocation of the right elbow. No focal bone lesion or bone destruction. No cortical erosion. Joint spaces appear intact. No significant effusion. Soft tissues are unremarkable. No radiopaque soft tissue foreign bodies or soft tissue gas.  IMPRESSION: Negative. Electronically Signed   By: Burman Nieves M.D.   On: 03/05/2023 20:07    Procedures Procedures  INCISION AND DRAINAGE Performed by: Geoffery Lyons Consent: Verbal consent obtained. Risks and benefits: risks, benefits and alternatives were discussed Type: abscess  Body area: right arm  Anesthesia: local infiltration  Incision was made with a scalpel.  Local anesthetic: lidocaine 1% without epinephrine  Anesthetic total: 4 ml  Complexity: complex Blunt dissection to break up loculations  Drainage: purulent  Drainage amount: moderate  Packing material: No packing placed  Patient tolerance: Patient tolerated the procedure well with  no immediate complications.    Medications Ordered in ED Medications  clindamycin (CLEOCIN) IVPB 600 mg (0 mg Intravenous Stopped 03/06/23 0012)  morphine (PF) 4 MG/ML injection 4 mg (4 mg Intravenous Given 03/05/23 2318)  lidocaine (PF) (XYLOCAINE) 1 % injection 5 mL (5 mLs Intradermal Given by Other 03/05/23 2318)  morphine (PF) 4 MG/ML injection 4 mg (4 mg Intravenous Given 03/06/23 0012)    ED Course/ Medical Decision Making/ A&P  Patient presenting with redness, pain, and swelling to the right elbow as described in the HPI.  This appears to be cellulitis with possible abscess formation.  The area was incised and drained and a moderate amount of pus was expressed.  Patient was given IV clindamycin along with morphine for pain.  Patient to be discharged with pain medication, warm compresses, clindamycin, and follow-up as needed.  Laboratory studies including CBC, metabolic panel, lactate all normal  X-rays showing no acute abnormality.  Final Clinical Impression(s) / ED Diagnoses Final diagnoses:  None    Rx / DC Orders ED Discharge Orders     None         Geoffery Lyons, MD 03/06/23 [phone]

## 2023-03-06 NOTE — Discharge Instructions (Signed)
Begin taking clindamycin as prescribed.  Begin taking Percocet as prescribed as needed for pain.  Apply warm compresses as frequently as possible for the next 3 days.  Follow-up with your primary doctor for a wound check in the next 3 days, and return to the ER if symptoms significantly worsen or change.

## 2023-03-08 ENCOUNTER — Inpatient Hospital Stay (HOSPITAL_BASED_OUTPATIENT_CLINIC_OR_DEPARTMENT_OTHER)
Admission: EM | Admit: 2023-03-08 | Discharge: 2023-03-10 | DRG: 603 | Disposition: A | Discharge status: Home / Self Care | Payer: Medicaid Other | Attending: Family Medicine | Admitting: Family Medicine

## 2023-03-08 ENCOUNTER — Encounter: Payer: Self-pay | Admitting: Family Medicine

## 2023-03-08 ENCOUNTER — Ambulatory Visit: Payer: Medicaid Other | Admitting: Family Medicine

## 2023-03-08 ENCOUNTER — Other Ambulatory Visit: Payer: Self-pay

## 2023-03-08 ENCOUNTER — Encounter (HOSPITAL_BASED_OUTPATIENT_CLINIC_OR_DEPARTMENT_OTHER): Payer: Self-pay

## 2023-03-08 VITALS — BP 105/64 | HR 73 | Temp 98.3°F | Ht 73.0 in | Wt 222.0 lb

## 2023-03-08 DIAGNOSIS — Z79899 Other long term (current) drug therapy: Secondary | ICD-10-CM

## 2023-03-08 DIAGNOSIS — R5381 Other malaise: Secondary | ICD-10-CM

## 2023-03-08 DIAGNOSIS — L0291 Cutaneous abscess, unspecified: Secondary | ICD-10-CM | POA: Diagnosis not present

## 2023-03-08 DIAGNOSIS — R410 Disorientation, unspecified: Secondary | ICD-10-CM | POA: Diagnosis not present

## 2023-03-08 DIAGNOSIS — R5383 Other fatigue: Secondary | ICD-10-CM

## 2023-03-08 DIAGNOSIS — L03113 Cellulitis of right upper limb: Secondary | ICD-10-CM

## 2023-03-08 DIAGNOSIS — L02413 Cutaneous abscess of right upper limb: Principal | ICD-10-CM | POA: Diagnosis present

## 2023-03-08 DIAGNOSIS — B999 Unspecified infectious disease: Secondary | ICD-10-CM | POA: Diagnosis not present

## 2023-03-08 DIAGNOSIS — F17290 Nicotine dependence, other tobacco product, uncomplicated: Secondary | ICD-10-CM | POA: Diagnosis present

## 2023-03-08 DIAGNOSIS — Z792 Long term (current) use of antibiotics: Secondary | ICD-10-CM

## 2023-03-08 DIAGNOSIS — Z885 Allergy status to narcotic agent status: Secondary | ICD-10-CM

## 2023-03-08 DIAGNOSIS — E785 Hyperlipidemia, unspecified: Secondary | ICD-10-CM | POA: Diagnosis present

## 2023-03-08 DIAGNOSIS — Z9049 Acquired absence of other specified parts of digestive tract: Secondary | ICD-10-CM

## 2023-03-08 LAB — COMPREHENSIVE METABOLIC PANEL
ALT: 24 U/L (ref 0–44)
AST: 23 U/L (ref 15–41)
Albumin: 4.1 g/dL (ref 3.5–5.0)
Alkaline Phosphatase: 88 U/L (ref 38–126)
Anion gap: 7 (ref 5–15)
BUN: 17 mg/dL (ref 6–20)
CO2: 29 mmol/L (ref 22–32)
Calcium: 9.5 mg/dL (ref 8.9–10.3)
Chloride: 103 mmol/L (ref 98–111)
Creatinine, Ser: 1.13 mg/dL (ref 0.61–1.24)
GFR, Estimated: >60 mL/min (ref >60)
Glucose, Bld: 85 mg/dL (ref 70–99)
Potassium: 4.2 mmol/L (ref 3.5–5.1)
Sodium: 139 mmol/L (ref 135–145)
Total Bilirubin: 0.4 mg/dL (ref 0.3–1.2)
Total Protein: 7 g/dL (ref 6.5–8.1)

## 2023-03-08 LAB — CBC WITH DIFFERENTIAL/PLATELET
Abs Immature Granulocytes: 0.01 10*3/uL (ref 0.00–0.07)
Basophils Absolute: 0.1 10*3/uL (ref 0.0–0.1)
Basophils Relative: 1 %
Eosinophils Absolute: 0.1 10*3/uL (ref 0.0–0.5)
Eosinophils Relative: 2 %
HCT: 39.6 % (ref 39.0–52.0)
Hemoglobin: 13.5 g/dL (ref 13.0–17.0)
Immature Granulocytes: 0 %
Lymphocytes Relative: 33 %
Lymphs Abs: 2 10*3/uL (ref 0.7–4.0)
MCH: 29.9 pg (ref 26.0–34.0)
MCHC: 34.1 g/dL (ref 30.0–36.0)
MCV: 87.6 fL (ref 80.0–100.0)
Monocytes Absolute: 0.4 10*3/uL (ref 0.1–1.0)
Monocytes Relative: 7 %
Neutro Abs: 3.4 10*3/uL (ref 1.7–7.7)
Neutrophils Relative %: 57 %
Platelets: 276 10*3/uL (ref 150–400)
RBC: 4.52 MIL/uL (ref 4.22–5.81)
RDW: 12.1 % (ref 11.5–15.5)
WBC: 6 10*3/uL (ref 4.0–10.5)
nRBC: 0 % (ref 0.0–0.2)

## 2023-03-08 LAB — LACTIC ACID, PLASMA: Lactic Acid, Venous: 0.7 mmol/L (ref 0.5–1.9)

## 2023-03-08 MED ORDER — OXYCODONE-ACETAMINOPHEN 5-325 MG PO TABS
1.0000 | ORAL_TABLET | Freq: Once | ORAL | Status: AC
  Administered 2023-03-08: 1 via ORAL
  Filled 2023-03-08: qty 1

## 2023-03-08 MED ORDER — LIDOCAINE HCL (PF) 1 % IJ SOLN
5.0000 mL | Freq: Once | INTRAMUSCULAR | Status: AC
  Administered 2023-03-08: 5 mL
  Filled 2023-03-08: qty 5

## 2023-03-08 MED ORDER — MORPHINE SULFATE (PF) 4 MG/ML IV SOLN
4.0000 mg | Freq: Once | INTRAVENOUS | Status: DC
  Filled 2023-03-08: qty 1

## 2023-03-08 MED ORDER — VANCOMYCIN HCL IN DEXTROSE 1-5 GM/200ML-% IV SOLN
1000.0000 mg | Freq: Once | INTRAVENOUS | Status: AC
  Administered 2023-03-08: 1000 mg via INTRAVENOUS
  Filled 2023-03-08: qty 200

## 2023-03-08 MED ORDER — FENTANYL CITRATE PF 50 MCG/ML IJ SOSY
100.0000 ug | PREFILLED_SYRINGE | Freq: Once | INTRAMUSCULAR | Status: AC
  Administered 2023-03-08: 100 ug via INTRAVENOUS
  Filled 2023-03-08: qty 2

## 2023-03-08 MED ORDER — FENTANYL CITRATE PF 50 MCG/ML IJ SOSY
50.0000 ug | PREFILLED_SYRINGE | Freq: Once | INTRAMUSCULAR | Status: AC
  Administered 2023-03-08: 50 ug via INTRAVENOUS
  Filled 2023-03-08: qty 1

## 2023-03-08 NOTE — ED Provider Notes (Signed)
[place] EMERGENCY DEPARTMENT AT [facility] Provider Note   CSN: [id number] Arrival date & time: 03/08/23  1937     History  Chief Complaint  Patient presents with   Abscess    Greg Mason is a 52 y.o. male.  Patient is a 52 year old male with no significant past medical history presenting to the emergency department for wound recheck.  The patient states that on Sunday he started to notice a bump and swelling to his left elbow.  He was seen at [facility] ER on Monday and was diagnosed with an abscess and cellulitis.  His abscess was drained at that time and he was sent home on clindamycin that he has been taking.  He states that he followed up with his primary doctor today who is concerned that the infection was looking worse and recommended that he return to the emergency department.  The patient states that he has had increased pain and has increased redness going down his forearm and up his bicep.  He states he has been able to move his elbow normally.  He denies any fevers.  The history is provided by the patient.  Abscess      Home Medications Prior to Admission medications   Medication Sig Start Date End Date Taking? Authorizing Provider  celecoxib (CELEBREX) 50 MG capsule Take by mouth.    [provider]  clindamycin (CLEOCIN) 300 MG capsule Take 1 capsule (300 mg total) by mouth 4 (four) times daily. X 7 days 03/06/23   Geoffery Lyons, MD  diphenhydrAMINE (BENADRYL) 25 mg capsule Take by mouth.    [provider]  omeprazole (PRILOSEC) 20 MG capsule Take 1 capsule (20 mg total) by mouth daily. 02/28/23   [name], Aleen Campi, FNP  oxyCODONE-acetaminophen (PERCOCET) 5-325 MG tablet Take 1-2 tablets by mouth every 6 (six) hours as needed. 03/06/23   Geoffery Lyons, MD      Allergies    Hydrocodone    Review of Systems   Review of Systems  Physical Exam Updated Vital Signs BP (!) 141/100 (BP Location: Left Arm)   Pulse 63   Temp 97.9  F (36.6 C)   Resp 16   Ht 6\' 1"  (1.854 m)   Wt 100.7 kg   SpO2 98%   BMI 29.29 kg/m  Physical Exam Vitals and nursing note reviewed.  Constitutional:      General: He is not in acute distress.    Appearance: Normal appearance.  HENT:     Head: Normocephalic and atraumatic.     Nose: Nose normal.     Mouth/Throat:     Mouth: Mucous membranes are moist.  Eyes:     Extraocular Movements: Extraocular movements intact.  Cardiovascular:     Rate and Rhythm: Normal rate.     Pulses: Normal pulses.  Pulmonary:     Effort: Pulmonary effort is normal.  Abdominal:     General: Abdomen is flat.  Musculoskeletal:        General: Normal range of motion.     Cervical back: Normal range of motion.     Comments: Elbow flexion/extension/supination/pronation intact  Skin:    General: Skin is warm and dry.     Comments: Approximately 2 cm area of palpable fluctuance just proximal to the right elbow with surrounding erythema, warmth and induration, erythema streaking to the palmar aspect of the right forearm and up the right bicep  Neurological:     General: No focal deficit present.  Mental Status: He is alert and oriented to person, place, and time.  Psychiatric:        Mood and Affect: Mood normal.        Behavior: Behavior normal.     ED Results / Procedures / Treatments   Labs (all labs ordered are listed, but only abnormal results are displayed) Labs Reviewed  LACTIC ACID, PLASMA  COMPREHENSIVE METABOLIC PANEL  CBC WITH DIFFERENTIAL/PLATELET    EKG None  Radiology No results found.  Procedures .Marland KitchenIncision and Drainage  Date/Time: 03/08/2023 10:41 PM  Performed by: Rexford Maus, DO Authorized by: Rexford Maus, DO   Consent:    Consent obtained:  Verbal   Consent given by:  Patient   Risks, benefits, and alternatives were discussed: yes     Risks discussed:  Bleeding, damage to other organs, incomplete drainage, infection and pain   Alternatives  discussed:  No treatment, delayed treatment and observation Universal protocol:    Procedure explained and questions answered to patient or proxy's satisfaction: yes     Patient identity confirmed:  Verbally with patient Location:    Type:  Abscess   Size:  2x2x0.5 cm   Location:  Upper extremity   Upper extremity location:  Elbow   Elbow location:  R elbow Pre-procedure details:    Skin preparation:  Chlorhexidine with alcohol Sedation:    Sedation type:  None Anesthesia:    Anesthesia method:  Local infiltration   Local anesthetic:  Lidocaine 1% w/o epi Procedure type:    Complexity:  Simple Procedure details:    Ultrasound guidance: yes     Needle aspiration: no     Incision types:  Stab incision   Incision depth:  Dermal   Wound management:  Probed and deloculated   Drainage:  Bloody and purulent   Drainage amount:  Moderate   Wound treatment:  Wound left open   Packing materials:  None Post-procedure details:    Procedure completion:  Tolerated well, no immediate complications Ultrasound ED Soft Tissue  Date/Time: 03/08/2023 10:41 PM  Performed by: Rexford Maus, DO Authorized by: Rexford Maus, DO   Procedure details:    Indications: localization of abscess and evaluate for cellulitis     Transverse view:  Visualized   Longitudinal view:  Visualized   Images: archived   Location:    Location: upper extremity     Side:  Right Findings:     abscess present    cellulitis present     Medications Ordered in ED Medications  vancomycin (VANCOCIN) IVPB 1000 mg/200 mL premix (1,000 mg Intravenous New Bag/Given 03/08/23 2235)  lidocaine (PF) (XYLOCAINE) 1 % injection 5 mL (has no administration in time range)  fentaNYL (SUBLIMAZE) injection 100 mcg (100 mcg Intravenous Given 03/08/23 2230)    ED Course/ Medical Decision Making/ A&P                             Medical Decision Making This patient presents to the ED with chief complaint(s) of  abscess/wound recheck with no pertinent past medical history which further complicates the presenting complaint. The complaint involves an extensive differential diagnosis and also carries with it a high risk of complications and morbidity.    The differential diagnosis includes abscess, cellulitis, sepsis, he has full elbow ROM making septic joint unlikely  Additional history obtained: Additional history obtained from N/A Records reviewed Primary Care Documents and outpatient ED records  ED Course and Reassessment: On patient's arrival he is afebrile, hemodynamically stable and in no acute distress.  Patient has no evidence of septic joint.  He had labs performed in triage that showed no signs of severe sepsis.  The patient did have bedside ultrasound performed that showed concern for reaccumulation of abscess and he does have significant erythema, warmth and induration concern for failure of outpatient antibiotics.  Patient will have I&D performed and will be started on IV vancomycin and will require admission for failure of his outpatient antibiotics.  Independent labs interpretation:  The following labs were independently interpreted: Within normal range  Independent visualization of imaging: -N/A  Consultation: - Consulted or discussed management/test interpretation w/ external professional: hospitalist   Consideration for admission or further workup: patient requires admission for cellulitis with failure of outpatient abx Social Determinants of health: N/A    Amount and/or Complexity of Data Reviewed Labs: ordered.  Risk Prescription drug management. Decision regarding hospitalization.          Final Clinical Impression(s) / ED Diagnoses Final diagnoses:  Abscess of right elbow  Cellulitis of right upper extremity    Rx / DC Orders ED Discharge Orders     None         Rexford Maus, DO 03/08/23 2329

## 2023-03-08 NOTE — Progress Notes (Signed)
Acute Office Visit  Subjective:  Patient ID: Greg Mason, male    DOB: 1970/10/28, 52 y.o.   MRN: [id number]  HPI Patient is in today for follow up of ED visit for cellulitis. Noticed it last Friday, states that it was "nothing" at that time. Does not think that he was bitten by anything. States that he had swollen elbow that erupted over the weekend. Patient had trouble recalling history during visit. At first, he stated that he went to the ED "yesterday" I stated that he went Monday per the chart review. He then remembered that he tried to go to work yesterday. Per patient and chart review, he present to [facility] on 6/10 and was given IV antibiotics. He reports that at the time he presented to the ED he was unable to move his arm. Reported that they gave him multiple doses of morphine before he was able to move it. He was discharged home with one week of clindamycin and 2 days of percocet. He has not been taking the percocet around the clock, so he has several doses remaining. 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 Last taken 03/07/23, approximately 11 pm. States that he still feels down. Reports compliance that he is taking clindamycin 4 times per day.   ROS As per HPI   Objective:  BP 105/64   Pulse 73   Temp 98.3 F (36.8 C)   Ht 6\' 1"  (1.854 m)   Wt 222 lb (100.7 kg)   SpO2 96%   BMI 29.29 kg/m   Physical Exam Constitutional:      Appearance: He is overweight. He is ill-appearing and toxic-appearing.     Comments: Not in normal state of appearance.   Cardiovascular:     Rate and Rhythm: Normal rate and regular rhythm.     Pulses:          Radial pulses are 2+ on the right side and 2+ on the left side.     Heart sounds: Normal heart sounds. No murmur heard. Pulmonary:     Effort: Pulmonary effort is normal.     Breath sounds: Normal breath sounds. No stridor, decreased air movement or transmitted upper airway sounds. No  decreased breath sounds, wheezing, rhonchi or rales.  Musculoskeletal:     Right elbow: Decreased range of motion. Tenderness present in olecranon process.     Right lower leg: No edema.     Left lower leg: No edema.  Skin:    General: Skin is warm.     Capillary Refill: Capillary refill takes less than 2 seconds.     Coloration: Skin is pale.     Findings: Laceration and wound present.     Comments: Erythematous, edematous abscess over right elbow with hard pockets surrounding the area. Tender to palpation   Neurological:     Mental Status: He is easily aroused. He is lethargic.  Psychiatric:        Attention and Perception: He is inattentive.        Mood and Affect: Mood is depressed.        Speech: Speech is delayed.        Behavior: Behavior is slowed and withdrawn. Behavior is cooperative.        Thought Content: Thought content does not include homicidal or suicidal ideation. Thought content does not include homicidal or suicidal plan.        Cognition  and Memory: Memory is impaired. He exhibits impaired recent memory.     Comments: Not at baseline     Assessment & Plan:  1. Abscess Discussed with patient that given his continued malaise and fatigue after IV antibiotics and over 48 hours of oral antibiotics that there is concern for systemic infection. Encouraged patient to present to the ED. Called and conferred with provider on call at [facility] ED, who agreed that ED evaluation is warranted. Instructed patient to present to ED today. Provided patient with written address and phone information. Reviewed Xray from [facility] on 03/04/22, which was normal at the time. In addition, patient had lactic acid level collected at [facility] on 03/04/22 that was normal. However, explained to patient that given his symptoms, recommend he follow up with ED.   2. Cellulitis of right upper extremity See above   3. Malaise and fatigue See above  4. Confusion associated with infection Patient had confusion  during the exam, which is new for him. Last dose of opioids were >12 hours prior. Discussed with patient that this added to concern for systemic infection and recommended he present to the ED.   The above assessment and management plan was discussed with the patient. The patient verbalized understanding of and has agreed to the management plan using shared-decision making. Patient is aware to call the clinic if they develop any new symptoms or if symptoms fail to improve or worsen. Patient is aware when to return to the clinic for a follow-up visit. Patient educated on when it is appropriate to go to the emergency department.   Return if symptoms worsen or fail to improve.  Neale Burly, DNP-FNP [facility] [address] [place], [place] [address] [phone]

## 2023-03-08 NOTE — ED Triage Notes (Signed)
Patient here POV from Home.  Endorses being in ED 3 days ago for an abscess to his Right Elbow. Drainage completed and IV antibiotics given. Currently taking Clindamycin. PCP instructed patient to seek repeat evaluation for possible repeat drainage.   Subjective Fever Saturday. None Since.   NAD Noted during Triage. A&Ox4. GCS 15. Ambulatory.

## 2023-03-08 NOTE — Patient Instructions (Signed)
Located in: [facility] Address: [address], [place], [place] [address] Phone: [phone]

## 2023-03-09 ENCOUNTER — Encounter (HOSPITAL_COMMUNITY): Payer: Self-pay | Admitting: Family Medicine

## 2023-03-09 ENCOUNTER — Inpatient Hospital Stay (HOSPITAL_COMMUNITY): Payer: Medicaid Other

## 2023-03-09 DIAGNOSIS — Z79899 Other long term (current) drug therapy: Secondary | ICD-10-CM | POA: Diagnosis not present

## 2023-03-09 DIAGNOSIS — E785 Hyperlipidemia, unspecified: Secondary | ICD-10-CM | POA: Diagnosis present

## 2023-03-09 DIAGNOSIS — Z9049 Acquired absence of other specified parts of digestive tract: Secondary | ICD-10-CM | POA: Diagnosis not present

## 2023-03-09 DIAGNOSIS — Z885 Allergy status to narcotic agent status: Secondary | ICD-10-CM | POA: Diagnosis not present

## 2023-03-09 DIAGNOSIS — L03113 Cellulitis of right upper limb: Secondary | ICD-10-CM | POA: Diagnosis not present

## 2023-03-09 DIAGNOSIS — Z792 Long term (current) use of antibiotics: Secondary | ICD-10-CM | POA: Diagnosis not present

## 2023-03-09 DIAGNOSIS — L0291 Cutaneous abscess, unspecified: Secondary | ICD-10-CM | POA: Diagnosis present

## 2023-03-09 DIAGNOSIS — L02413 Cutaneous abscess of right upper limb: Secondary | ICD-10-CM | POA: Diagnosis not present

## 2023-03-09 DIAGNOSIS — F17290 Nicotine dependence, other tobacco product, uncomplicated: Secondary | ICD-10-CM | POA: Diagnosis present

## 2023-03-09 DIAGNOSIS — M19031 Primary osteoarthritis, right wrist: Secondary | ICD-10-CM | POA: Diagnosis not present

## 2023-03-09 LAB — BASIC METABOLIC PANEL
Anion gap: 8 (ref 5–15)
BUN: 14 mg/dL (ref 6–20)
CO2: 26 mmol/L (ref 22–32)
Calcium: 8.9 mg/dL (ref 8.9–10.3)
Chloride: 102 mmol/L (ref 98–111)
Creatinine, Ser: 1.13 mg/dL (ref 0.61–1.24)
GFR, Estimated: >60 mL/min (ref >60)
Glucose, Bld: 99 mg/dL (ref 70–99)
Potassium: 3.9 mmol/L (ref 3.5–5.1)
Sodium: 136 mmol/L (ref 135–145)

## 2023-03-09 LAB — CBC
HCT: 41.1 % (ref 39.0–52.0)
Hemoglobin: 13.8 g/dL (ref 13.0–17.0)
MCH: 29.8 pg (ref 26.0–34.0)
MCHC: 33.6 g/dL (ref 30.0–36.0)
MCV: 88.8 fL (ref 80.0–100.0)
Platelets: 274 10*3/uL (ref 150–400)
RBC: 4.63 MIL/uL (ref 4.22–5.81)
RDW: 12 % (ref 11.5–15.5)
WBC: 5.8 10*3/uL (ref 4.0–10.5)
nRBC: 0 % (ref 0.0–0.2)

## 2023-03-09 LAB — HIV ANTIBODY (ROUTINE TESTING W REFLEX): HIV Screen 4th Generation wRfx: NONREACTIVE

## 2023-03-09 MED ORDER — IPRATROPIUM-ALBUTEROL 0.5-2.5 (3) MG/3ML IN SOLN: 3.0000 mL | RESPIRATORY_TRACT | Status: DC | PRN

## 2023-03-09 MED ORDER — POLYETHYLENE GLYCOL 3350 17 G PO PACK: 17.0000 g | PACK | Freq: Every day | ORAL | Status: DC | PRN

## 2023-03-09 MED ORDER — ONDANSETRON HCL 4 MG/2ML IJ SOLN: 4.0000 mg | Freq: Four times a day (QID) | INTRAMUSCULAR | Status: DC | PRN

## 2023-03-09 MED ORDER — ENOXAPARIN SODIUM 40 MG/0.4ML IJ SOSY
40.0000 mg | PREFILLED_SYRINGE | INTRAMUSCULAR | Status: DC
  Administered 2023-03-09 – 2023-03-10 (×2): 40 mg via SUBCUTANEOUS
  Filled 2023-03-09 (×2): qty 0.4

## 2023-03-09 MED ORDER — ACETAMINOPHEN 650 MG RE SUPP: 650.0000 mg | Freq: Four times a day (QID) | RECTAL | Status: DC | PRN

## 2023-03-09 MED ORDER — ONDANSETRON HCL 4 MG PO TABS: 4.0000 mg | ORAL_TABLET | Freq: Four times a day (QID) | ORAL | Status: DC | PRN

## 2023-03-09 MED ORDER — HYDRALAZINE HCL 20 MG/ML IJ SOLN: 10.0000 mg | INTRAMUSCULAR | Status: DC | PRN

## 2023-03-09 MED ORDER — METOPROLOL TARTRATE 5 MG/5ML IV SOLN: 5.0000 mg | INTRAVENOUS | Status: DC | PRN

## 2023-03-09 MED ORDER — METHOCARBAMOL 1000 MG/10ML IJ SOLN
500.0000 mg | Freq: Four times a day (QID) | INTRAVENOUS | Status: DC | PRN
  Administered 2023-03-09: 500 mg via INTRAVENOUS
  Filled 2023-03-09: qty 500

## 2023-03-09 MED ORDER — ACETAMINOPHEN 325 MG PO TABS: 650.0000 mg | ORAL_TABLET | Freq: Four times a day (QID) | ORAL | Status: DC | PRN

## 2023-03-09 MED ORDER — VANCOMYCIN HCL IN DEXTROSE 1-5 GM/200ML-% IV SOLN
1000.0000 mg | Freq: Once | INTRAVENOUS | Status: AC
  Administered 2023-03-09: 1000 mg via INTRAVENOUS
  Filled 2023-03-09: qty 200

## 2023-03-09 MED ORDER — HYDROMORPHONE HCL 1 MG/ML IJ SOLN
0.5000 mg | INTRAMUSCULAR | Status: DC | PRN
  Administered 2023-03-09: 0.5 mg via INTRAVENOUS
  Filled 2023-03-09: qty 0.5

## 2023-03-09 MED ORDER — SENNOSIDES-DOCUSATE SODIUM 8.6-50 MG PO TABS: 1.0000 | ORAL_TABLET | Freq: Every evening | ORAL | Status: DC | PRN

## 2023-03-09 MED ORDER — PANTOPRAZOLE SODIUM 40 MG PO TBEC
40.0000 mg | DELAYED_RELEASE_TABLET | Freq: Every day | ORAL | Status: DC
  Administered 2023-03-09 – 2023-03-10 (×2): 40 mg via ORAL
  Filled 2023-03-09 (×2): qty 1

## 2023-03-09 MED ORDER — VANCOMYCIN HCL 1500 MG/300ML IV SOLN
1500.0000 mg | Freq: Two times a day (BID) | INTRAVENOUS | Status: DC
  Administered 2023-03-09 – 2023-03-10 (×2): 1500 mg via INTRAVENOUS
  Filled 2023-03-09 (×3): qty 300

## 2023-03-09 MED ORDER — MORPHINE SULFATE (PF) 2 MG/ML IV SOLN: 1.0000 mg | INTRAVENOUS | Status: AC | PRN

## 2023-03-09 MED ORDER — FENTANYL CITRATE PF 50 MCG/ML IJ SOSY
50.0000 ug | PREFILLED_SYRINGE | INTRAMUSCULAR | Status: DC | PRN
  Administered 2023-03-09 (×7): 50 ug via INTRAVENOUS
  Filled 2023-03-09 (×7): qty 1

## 2023-03-09 MED ORDER — IOHEXOL 300 MG/ML  SOLN
100.0000 mL | Freq: Once | INTRAMUSCULAR | Status: AC | PRN
  Administered 2023-03-09: 100 mL via INTRAVENOUS

## 2023-03-09 MED ORDER — BISACODYL 5 MG PO TBEC: 5.0000 mg | DELAYED_RELEASE_TABLET | Freq: Every day | ORAL | Status: DC | PRN

## 2023-03-09 MED ORDER — OXYCODONE HCL 5 MG PO TABS
5.0000 mg | ORAL_TABLET | ORAL | Status: DC | PRN
  Administered 2023-03-09: 5 mg via ORAL
  Filled 2023-03-09: qty 1

## 2023-03-09 MED ORDER — TRAZODONE HCL 50 MG PO TABS: 50.0000 mg | ORAL_TABLET | Freq: Every evening | ORAL | Status: DC | PRN

## 2023-03-09 MED ORDER — GUAIFENESIN 100 MG/5ML PO LIQD: 5.0000 mL | ORAL | Status: DC | PRN

## 2023-03-09 NOTE — Progress Notes (Signed)
PROGRESS NOTE    Greg Mason  ZOX:[id number] DOB: [birth year] DOA: 03/08/2023 PCP: Arrie Senate, FNP   Brief Narrative:  52 year old with history of HLD comes to the ED with complaints of right elbow pain, erythema and swelling.  Initially went to [facility] on 6/3 with 3 days of symptoms where I&D was performed and patient was discharged on oral clindamycin.  After going home this continued to worsen and was seen by his PCP who recommended to return back to the ER.  In the ER another I&D was performed and patient was treated with vancomycin, Percocet and fentanyl and admitted to the hospital.   Assessment & Plan:  Principal Problem:   Cellulitis of right arm     RUE abscess & cellulitis, slowly improving -Status post I&D in the ER.  He has clearly failed outpatient antibiotics. unfortunately no cultures were sent on admission.  Patient currently on IV vancomycin.  Will get CT of the right upper extremity rule out any deeper infection -Pain control, bowel regimen  DVT prophylaxis: Lovenox Code Status: Full code Family Communication: Family at bedside Status is: Inpatient Continue patient on IV antibiotics       Diet Orders (From admission, onward)     Start     Ordered   03/09/23 0108  Diet regular Room service appropriate? Yes; Fluid consistency: Thin  Diet effective now       Question Answer Comment  Room service appropriate? Yes   Fluid consistency: Thin      03/09/23 0108            Subjective: Tells me upper extremity swelling and erythema has slowly improved but still feels like some discomfort in the deeper area.  Also states previously marked area of infection is noted to have some improvement   Examination:  General exam: Appears calm and comfortable  Respiratory system: Clear to auscultation. Respiratory effort normal. Cardiovascular system: S1 & S2 heard, RRR. No JVD, murmurs, rubs, gallops or clicks. No pedal  edema. Gastrointestinal system: Abdomen is nondistended, soft and nontender. No organomegaly or masses felt. Normal bowel sounds heard. Central nervous system: Alert and oriented. No focal neurological deficits. Extremities: Symmetric 5 x 5 power. Skin: Right elbow dressing in place.  S previously marked area of infection with surgical marker shows clear regression of his superficial infection Psychiatry: Judgement and insight appear normal. Mood & affect appropriate.  Objective: Vitals:   03/08/23 2336 03/09/23 0056 03/09/23 0258 03/09/23 0601  BP:  (!) 140/81 127/83 121/67  Pulse:  (!) 57 (!) 59   Resp:  18 17 17   Temp: 98 F (36.7 C) 97.7 F (36.5 C) 97.8 F (36.6 C) 97.7 F (36.5 C)  TempSrc: Oral Oral Oral   SpO2:  100% 97% 100%  Weight:      Height:        Intake/Output Summary (Last 24 hours) at 03/09/2023 0752 Last data filed at 03/08/2023 2334 Gross per 24 hour  Intake 191.23 ml  Output --  Net 191.23 ml   Filed Weights   03/08/23 1941  Weight: 100.7 kg    Scheduled Meds:  enoxaparin (LOVENOX) injection  40 mg Subcutaneous Q24H   pantoprazole  40 mg Oral Daily   Continuous Infusions:  methocarbamol (ROBAXIN) IV     vancomycin      Nutritional status     Body mass index is 29.29 kg/m.  Data Reviewed:   CBC: Recent Labs  Lab 03/05/23 1957 03/08/23 1952 03/09/23  0347  WBC 11.5* 6.0 5.8  NEUTROABS 9.2* 3.4  --   HGB 13.0 13.5 13.8  HCT 39.0 39.6 41.1  MCV 89.9 87.6 88.8  PLT 223 276 274   Basic Metabolic Panel: Recent Labs  Lab 03/05/23 1957 03/08/23 1952 03/09/23 0347  NA 136 139 136  K 3.9 4.2 3.9  CL 105 103 102  CO2 25 29 26   GLUCOSE 135* 85 99  BUN 19 17 14   CREATININE 1.15 1.13 1.13  CALCIUM 8.5* 9.5 8.9   GFR: Estimated Creatinine Clearance: 95.4 mL/min (by C-G formula based on SCr of 1.13 mg/dL). Liver Function Tests: Recent Labs  Lab 03/05/23 1957 03/08/23 1952  AST 25 23  ALT 27 24  ALKPHOS 75 88  BILITOT 0.5 0.4   PROT 6.7 7.0  ALBUMIN 3.6 4.1   No results for input(s): "LIPASE", "AMYLASE" in the last 168 hours. No results for input(s): "AMMONIA" in the last 168 hours. Coagulation Profile: No results for input(s): "INR", "PROTIME" in the last 168 hours. Cardiac Enzymes: No results for input(s): "CKTOTAL", "CKMB", "CKMBINDEX", "TROPONINI" in the last 168 hours. BNP (last 3 results) No results for input(s): "PROBNP" in the last 8760 hours. HbA1C: No results for input(s): "HGBA1C" in the last 72 hours. CBG: No results for input(s): "GLUCAP" in the last 168 hours. Lipid Profile: No results for input(s): "CHOL", "HDL", "LDLCALC", "TRIG", "CHOLHDL", "LDLDIRECT" in the last 72 hours. Thyroid Function Tests: No results for input(s): "TSH", "T4TOTAL", "FREET4", "T3FREE", "THYROIDAB" in the last 72 hours. Anemia Panel: No results for input(s): "VITAMINB12", "FOLATE", "FERRITIN", "TIBC", "IRON", "RETICCTPCT" in the last 72 hours. Sepsis Labs: Recent Labs  Lab 03/05/23 1957 03/05/23 2200 03/08/23 1952  LATICACIDVEN 1.2 1.0 0.7    No results found for this or any previous visit (from the past 240 hour(s)).       Radiology Studies: No results found.         LOS: 0 days   Time spent= 35 mins    Othal Kubitz Joline Maxcy, MD [facility]  If 7PM-7AM, please contact night-coverage  03/09/2023, 7:52 AM

## 2023-03-09 NOTE — Progress Notes (Signed)
Pharmacy Antibiotic Note  Greg Mason is a 52 y.o. male admitted on 03/08/2023 with cellulitis. Was seen earlier in the week and started on clindamycin.  Primary MD concerned the infection was looking worse and recommended pt return to the ED. Pharmacy has been consulted for vancomycin dosing.    Plan: Pt received vanc 1gm in ED will give another 1gm for a total load of 2gm then Vancomycin 1500mg  IV q12h (AUC 543.5, Scr 1.13) Follow renal function and clincial course  Height: 6\' 1"  (185.4 cm) Weight: 100.7 kg (222 lb 0.1 oz) IBW/kg (Calculated) : 79.9  Temp (24hrs), Avg:98 F (36.7 C), Min:97.7 F (36.5 C), Max:98.3 F (36.8 C)  Recent Labs  Lab 03/05/23 1957 03/05/23 2200 03/08/23 1952  WBC 11.5*  --  6.0  CREATININE 1.15  --  1.13  LATICACIDVEN 1.2 1.0 0.7    Estimated Creatinine Clearance: 95.4 mL/min (by C-G formula based on SCr of 1.13 mg/dL).    Allergies  Allergen Reactions   Hydrocodone Itching    Antimicrobials this admission: 6/13 vanc >>   Dose adjustments this admission:   Microbiology results:  Thank you for allowing pharmacy to be a part of this patient's care.  Arley Phenix RPh 03/09/2023, 1:20 AM

## 2023-03-09 NOTE — Progress Notes (Signed)
The patient has decided he would like to have the Fentanyl. Messaged Dr. Antionette Char.

## 2023-03-09 NOTE — Plan of Care (Signed)
Problem: Clinical Measurements: Goal: Ability to maintain clinical measurements within normal limits will improve Outcome: Progressing   Problem: Nutrition: Goal: Adequate nutrition will be maintained Outcome: Progressing   Problem: Coping: Goal: Level of anxiety will decrease Outcome: Progressing   Problem: Pain Managment: Goal: General experience of comfort will improve Outcome: Progressing   Problem: Safety: Goal: Ability to remain free from injury will improve Outcome: Progressing   Haydee Salter, RN 03/09/23 7:39 AM

## 2023-03-09 NOTE — TOC CM/SW Note (Signed)
Transition of Care [facility]) - Inpatient Brief Assessment  Patient Details  Name: Greg Mason MRN: [id number] Date of Birth: 10-26-1970  Transition of Care [facility]) CM/SW Contact:    Ewing Schlein, LCSW Phone Number: 03/09/2023, 10:53 AM  Clinical Narrative: Screening completed. No needs identified at this time.  Transition of Care Asessment: Insurance and Status: Insurance coverage has been reviewed Patient has primary care physician: Yes Home environment has been reviewed: Yes Prior level of function:: Independent at baseline Prior/Current Home Services: No current home services Social Determinants of Health Reivew: SDOH reviewed no interventions necessary Readmission risk has been reviewed: Yes Transition of care needs: no transition of care needs at this time

## 2023-03-09 NOTE — H&P (Signed)
History and Physical    Unk Lybeck WGN:[id number] DOB: Feb 06, 1971 DOA: 03/08/2023  PCP: Arrie Senate, FNP   Patient coming from: Home   Chief Complaint: Right elbow pain, swelling, and redness   HPI: Greg Mason is a pleasant 52 y.o. male with medical history significant for hyperlipidemia presents to the emergency department with right elbow pain, redness, and swelling.  He was initially seen in the [facility], ED on 03/05/2023 with 3 days of symptoms at that time.  I&D was performed in the ED and the patient was discharged with clindamycin.  He continued to have erythema, pain, and swelling about the elbow as well as general malaise and fatigue.  He was seen by his PCP today, there was concern for ongoing infection, and he was advised to seek further evaluation in the ED.  [facility] ED Course: Upon arrival to the ED, patient is found to be afebrile and saturating well on room air with stable blood pressure.  Basic blood work including renal function, WBC, and lactic acid are all normal.  I&D was performed by the ED physician and the patient was treated with vancomycin, Percocet, and fentanyl.  He was transferred to [facility] for admission.  Review of Systems:  All other systems reviewed and apart from HPI, are negative.  Past Medical History:  Diagnosis Date   Hyperlipidemia     Past Surgical History:  Procedure Laterality Date   APPENDECTOMY     DG ANKLE LEFT COMPLETE ([facility] HX)     HERNIA REPAIR     KNEE ARTHROSCOPY      Social History:   reports that he has never smoked. His smokeless tobacco use includes snuff. He reports that he does not drink alcohol and does not use drugs.  Allergies  Allergen Reactions   Hydrocodone Itching    Family History  Problem Relation Age of Onset   Diabetes Mother    Seizures Sister      Prior to Admission medications   Medication Sig Start Date End Date Taking? Authorizing Provider  celecoxib  (CELEBREX) 50 MG capsule Take by mouth.    [provider]  clindamycin (CLEOCIN) 300 MG capsule Take 1 capsule (300 mg total) by mouth 4 (four) times daily. X 7 days 03/06/23   Geoffery Lyons, MD  diphenhydrAMINE (BENADRYL) 25 mg capsule Take by mouth.    [provider]  omeprazole (PRILOSEC) 20 MG capsule Take 1 capsule (20 mg total) by mouth daily. 02/28/23   [name], Aleen Campi, FNP  oxyCODONE-acetaminophen (PERCOCET) 5-325 MG tablet Take 1-2 tablets by mouth every 6 (six) hours as needed. 03/06/23   Geoffery Lyons, MD    Physical Exam: Vitals:   03/08/23 1941 03/08/23 1943 03/08/23 2336 03/09/23 0056  BP:  (!) 141/100  (!) 140/81  Pulse:  63  (!) 57  Resp:  16  18  Temp:  97.9 F (36.6 C) 98 F (36.7 C) 97.7 F (36.5 C)  TempSrc:   Oral Oral  SpO2:  98%  100%  Weight: 100.7 kg     Height: 6\' 1"  (1.854 m)        Constitutional: NAD, no pallor or diaphoresis   Eyes: PERTLA, lids and conjunctivae normal ENMT: Mucous membranes are moist. Posterior pharynx clear of any exudate or lesions.   Neck: supple, no masses  Respiratory: no wheezing, no crackles. No accessory muscle use.  Cardiovascular: S1 & S2 heard, regular rate and rhythm. No extremity edema.   Abdomen:  No distension, no tenderness, soft. Bowel sounds active.  Musculoskeletal: no clubbing / cyanosis. No joint deformity upper and lower extremities.   Skin: Faint erythema, edema, and heat surrounding right elbow. Warm, dry, well-perfused. Neurologic: CN 2-12 grossly intact. Moving all extremities. Alert and oriented.  Psychiatric: Calm. Cooperative.    Labs and Imaging on Admission: I have personally reviewed following labs and imaging studies  CBC: Recent Labs  Lab 03/05/23 1957 03/08/23 1952  WBC 11.5* 6.0  NEUTROABS 9.2* 3.4  HGB 13.0 13.5  HCT 39.0 39.6  MCV 89.9 87.6  PLT 223 276   Basic Metabolic Panel: Recent Labs  Lab 03/05/23 1957 03/08/23 1952  NA 136 139  K 3.9 4.2  CL 105  103  CO2 25 29  GLUCOSE 135* 85  BUN 19 17  CREATININE 1.15 1.13  CALCIUM 8.5* 9.5   GFR: Estimated Creatinine Clearance: 95.4 mL/min (by C-G formula based on SCr of 1.13 mg/dL). Liver Function Tests: Recent Labs  Lab 03/05/23 1957 03/08/23 1952  AST 25 23  ALT 27 24  ALKPHOS 75 88  BILITOT 0.5 0.4  PROT 6.7 7.0  ALBUMIN 3.6 4.1   No results for input(s): "LIPASE", "AMYLASE" in the last 168 hours. No results for input(s): "AMMONIA" in the last 168 hours. Coagulation Profile: No results for input(s): "INR", "PROTIME" in the last 168 hours. Cardiac Enzymes: No results for input(s): "CKTOTAL", "CKMB", "CKMBINDEX", "TROPONINI" in the last 168 hours. BNP (last 3 results) No results for input(s): "PROBNP" in the last 8760 hours. HbA1C: No results for input(s): "HGBA1C" in the last 72 hours. CBG: No results for input(s): "GLUCAP" in the last 168 hours. Lipid Profile: No results for input(s): "CHOL", "HDL", "LDLCALC", "TRIG", "CHOLHDL", "LDLDIRECT" in the last 72 hours. Thyroid Function Tests: No results for input(s): "TSH", "T4TOTAL", "FREET4", "T3FREE", "THYROIDAB" in the last 72 hours. Anemia Panel: No results for input(s): "VITAMINB12", "FOLATE", "FERRITIN", "TIBC", "IRON", "RETICCTPCT" in the last 72 hours. Urine analysis:    Component Value Date/Time   COLORURINE YELLOW 03/05/2023 2147   APPEARANCEUR CLEAR 03/05/2023 2147   LABSPEC 1.024 03/05/2023 2147   PHURINE 5.0 03/05/2023 2147   GLUCOSEU NEGATIVE 03/05/2023 2147   HGBUR MODERATE (A) 03/05/2023 2147   BILIRUBINUR NEGATIVE 03/05/2023 2147   KETONESUR NEGATIVE 03/05/2023 2147   PROTEINUR NEGATIVE 03/05/2023 2147   NITRITE NEGATIVE 03/05/2023 2147   LEUKOCYTESUR NEGATIVE 03/05/2023 2147   Sepsis Labs: @LABRCNTIP (procalcitonin:4,lacticidven:4) )No results found for this or any previous visit (from the past 240 hour(s)).   Radiological Exams on Admission: No results found.   Assessment/Plan  1. RUE  abscess & cellulitis  - S/p I&D in ED   - Continue vancomycin for surrounding cellulitis, consider advanced imaging and/or ortho consult if worsens or fails to improve as expected with antibiotics     DVT prophylaxis: Lovenox  Code Status: Full  Level of Care: Level of care: Med-Surg Family Communication: None present  Disposition Plan:  Patient is from: Home  Anticipated d/c is to: Home  Anticipated d/c date is: 03/11/23  Patient currently: Pending improvement in cellulitis  Consults called: None  Admission status: Inpatient    Briscoe Deutscher, MD [facility]  03/09/2023, 1:08 AM

## 2023-03-09 NOTE — ED Notes (Signed)
Called [facility] 3rd floor to give report to the RN receiving Pt care x3 with no answer. [facility] has left to transport Pt to [facility].

## 2023-03-10 DIAGNOSIS — L03113 Cellulitis of right upper limb: Secondary | ICD-10-CM | POA: Diagnosis not present

## 2023-03-10 LAB — BASIC METABOLIC PANEL
Anion gap: 11 (ref 5–15)
BUN: 17 mg/dL (ref 6–20)
CO2: 24 mmol/L (ref 22–32)
Calcium: 9 mg/dL (ref 8.9–10.3)
Chloride: 102 mmol/L (ref 98–111)
Creatinine, Ser: 1.04 mg/dL (ref 0.61–1.24)
GFR, Estimated: >60 mL/min (ref >60)
Glucose, Bld: 118 mg/dL — ABNORMAL HIGH (ref 70–99)
Potassium: 3.7 mmol/L (ref 3.5–5.1)
Sodium: 137 mmol/L (ref 135–145)

## 2023-03-10 LAB — MAGNESIUM: Magnesium: 1.9 mg/dL (ref 1.7–2.4)

## 2023-03-10 LAB — CBC
HCT: 47.8 % (ref 39.0–52.0)
Hemoglobin: 16 g/dL (ref 13.0–17.0)
MCH: 30 pg (ref 26.0–34.0)
MCHC: 33.5 g/dL (ref 30.0–36.0)
MCV: 89.7 fL (ref 80.0–100.0)
Platelets: 221 10*3/uL (ref 150–400)
RBC: 5.33 MIL/uL (ref 4.22–5.81)
RDW: 11.9 % (ref 11.5–15.5)
WBC: 9 10*3/uL (ref 4.0–10.5)
nRBC: 0 % (ref 0.0–0.2)

## 2023-03-10 MED ORDER — SULFAMETHOXAZOLE-TRIMETHOPRIM 800-160 MG PO TABS: 1.0000 | ORAL_TABLET | Freq: Two times a day (BID) | ORAL | 0 refills | Status: AC

## 2023-03-10 NOTE — Progress Notes (Signed)
Provided discharge education/instructions, all questions and concerns addressed. Pt not in acute distress, discharged home with all of his belongings.

## 2023-03-10 NOTE — Plan of Care (Signed)
  Problem: Education: Goal: Knowledge of General Education information will improve Description: Including pain rating scale, medication(s)/side effects and non-pharmacologic comfort measures Outcome: Progressing   Problem: Health Behavior/Discharge Planning: Goal: Ability to manage health-related needs will improve Outcome: Progressing   Problem: Clinical Measurements: Goal: Ability to maintain clinical measurements within normal limits will improve Outcome: Progressing   Problem: Activity: Goal: Risk for activity intolerance will decrease Outcome: Progressing   Problem: Coping: Goal: Level of anxiety will decrease Outcome: Progressing   Problem: Elimination: Goal: Will not experience complications related to bowel motility Outcome: Progressing   Problem: Pain Managment: Goal: General experience of comfort will improve Outcome: Progressing   Problem: Safety: Goal: Ability to remain free from injury will improve Outcome: Progressing   

## 2023-03-10 NOTE — Discharge Summary (Signed)
Physician Discharge Summary   Patient: Greg Mason MRN: [id number] DOB: [birth year]  Admit date:     03/08/2023  Discharge date: 03/10/23  Discharge Physician: Meredeth Ide   PCP: Arrie Senate, FNP   Recommendations at discharge:    Follow up PCP in 1 week  Discharge Diagnoses: Principal Problem:   Cellulitis of right arm  Resolved Problems:   * No resolved hospital problems. *  Hospital Course: 52 year old male with history of hyperlipidemia came to ED with complaints of right elbow pain, erythema, swelling.  [facility]ially went to [facility] with 3 days of symptoms where I&D was performed and patient was discharged on oral clindamycin.  After going home this continued to worsen, he was seen by PCP who asked him to go back to the ER.  In the ED and other I&D was performed and patient was started on vancomycin, fentanyl and Percocet.  Admitted to the hospital   Assessment and Plan:  Right upper extremity abscess and cellulitis -Improved; s/p I&D in the ED -Failed outpatient treatment -CT of the forearm did not show any deep soft tissue infection, abscess -- Significant improvement with vancomycin -will discharge home on po Bactrim DS 1 tab po bid x 3 days          Consultants:  Procedures performed:  Disposition: Home Diet recommendation:  Discharge Diet Orders (From admission, onward)     Start     Ordered   03/10/23 0000  Diet - low sodium heart healthy        03/10/23 0818           Regular diet DISCHARGE MEDICATION: Allergies as of 03/10/2023       Reactions   Hydrocodone Itching        Medication List     STOP taking these medications    clindamycin 300 MG capsule Commonly known as: CLEOCIN   meloxicam 7.5 MG tablet Commonly known as: MOBIC       TAKE these medications    celecoxib 200 MG capsule Commonly known as: CELEBREX Take 200 mg by mouth daily.   diphenhydrAMINE 25 mg capsule Commonly known as: BENADRYL Take  25 mg by mouth at bedtime as needed for itching.   omeprazole 20 MG capsule Commonly known as: PRILOSEC Take 1 capsule (20 mg total) by mouth daily.   oxyCODONE-acetaminophen 5-325 MG tablet Commonly known as: Percocet Take 1-2 tablets by mouth every 6 (six) hours as needed. What changed: reasons to take this   sulfamethoxazole-trimethoprim 800-160 MG tablet Commonly known as: BACTRIM DS Take 1 tablet by mouth 2 (two) times daily for 3 days.   Ventolin HFA 108 (90 Base) MCG/ACT inhaler Generic drug: albuterol Inhale into the lungs every 6 (six) hours as needed for wheezing or shortness of breath.        Discharge Exam: Filed Weights   03/08/23 1941  Weight: 100.7 kg   Skin- no erythema of left arm, nontender, mild swelling in left upper extremity above elbow  Condition at discharge: good  The results of significant diagnostics from this hospitalization (including imaging, microbiology, ancillary and laboratory) are listed below for reference.   Imaging Studies: CT FOREARM RIGHT W CONTRAST  Result Date: 03/09/2023 CLINICAL DATA:  Soft tissue mass, forearm, deep. Rule out deep infection. EXAM: CT OF THE UPPER RIGHT EXTREMITY WITH CONTRAST TECHNIQUE: Multidetector CT imaging of the right forearm was performed according to the standard protocol following intravenous contrast administration. RADIATION DOSE REDUCTION: This  exam was performed according to the departmental dose-optimization program which includes automated exposure control, adjustment of the mA and/or kV according to patient size and/or use of iterative reconstruction technique. CONTRAST:  OMNIPAQUE IOHEXOL 300 MG/ML  SOLN COMPARISON:  Elbow radiographs 03/05/2023 FINDINGS: Bones/Joint/Cartilage No evidence of acute fracture, dislocation or osteomyelitis. There are mild degenerative changes at the elbow without significant joint effusion. There are moderately advanced degenerative changes at the 1st carpometacarpal  articulation with radial subluxation and osteophytes. Otherwise mild intercarpal degenerative changes. Possible old fracture of the ulnar styloid with nonunion. Ligaments Suboptimally assessed by CT. Muscles and Tendons The forearm muscles appear unremarkable, without atrophy, abnormal enhancement or focal fluid collection. As evaluated by CT, no tendon abnormalities are identified. Soft tissues Dorsal subcutaneous edema may reflect cellulitis. No focal fluid collection, foreign body or soft tissue emphysema identified. The vascular structures appear unremarkable. IMPRESSION: 1. Dorsal subcutaneous edema may reflect cellulitis. No focal fluid collection, foreign body or soft tissue emphysema identified. 2. No evidence of deep soft tissue infection, osteomyelitis or septic arthritis. 3. Degenerative changes as described, most advanced at the 1st carpometacarpal articulation. Electronically Signed   By: Carey Bullocks M.D.   On: 03/09/2023 16:28   DG Elbow 2 Views Right  Result Date: 03/05/2023 CLINICAL DATA:  Infection. Joint pain, swelling, and redness since Friday. No known injury. EXAM: RIGHT ELBOW - 2 VIEW COMPARISON:  None Available. FINDINGS: No evidence of acute fracture or dislocation of the right elbow. No focal bone lesion or bone destruction. No cortical erosion. Joint spaces appear intact. No significant effusion. Soft tissues are unremarkable. No radiopaque soft tissue foreign bodies or soft tissue gas. IMPRESSION: Negative. Electronically Signed   By: Burman Nieves M.D.   On: 03/05/2023 20:07   DG Knee AP/LAT W/Sunrise Left  Result Date: 02/22/2023 Imaging of the left knee Status post left total knee arthroplasty The alignment looks good I do not see any loosening The patella button is centered for the most part Impression normal appearance of the left total knee arthroplasty    Microbiology: No results found for this or any previous visit.  Labs: CBC: Recent Labs  Lab 03/05/23 1957  03/08/23 1952 03/09/23 0347 03/10/23 0410  WBC 11.5* 6.0 5.8 9.0  NEUTROABS 9.2* 3.4  --   --   HGB 13.0 13.5 13.8 16.0  HCT 39.0 39.6 41.1 47.8  MCV 89.9 87.6 88.8 89.7  PLT 223 276 274 221   Basic Metabolic Panel: Recent Labs  Lab 03/05/23 1957 03/08/23 1952 03/09/23 0347 03/10/23 0410  NA 136 139 136 137  K 3.9 4.2 3.9 3.7  CL 105 103 102 102  CO2 25 29 26 24   GLUCOSE 135* 85 99 118*  BUN 19 17 14 17   CREATININE 1.15 1.13 1.13 1.04  CALCIUM 8.5* 9.5 8.9 9.0  MG  --   --   --  1.9   Liver Function Tests: Recent Labs  Lab 03/05/23 1957 03/08/23 1952  AST 25 23  ALT 27 24  ALKPHOS 75 88  BILITOT 0.5 0.4  PROT 6.7 7.0  ALBUMIN 3.6 4.1   CBG: No results for input(s): "GLUCAP" in the last 168 hours.  Discharge time spent: greater than 30 minutes.  Signed: Meredeth Ide, MD [facility] 03/10/2023

## 2023-03-10 NOTE — TOC Transition Note (Signed)
Transition of Care [facility]) - CM/SW Discharge Note   Patient Details  Name: Greg Mason MRN: [id number] Date of Birth: [birth year]  Transition of Care [facility]) CM/SW Contact:  Adrian Prows, RN Phone Number: 03/10/2023, 11:20 AM   Clinical Narrative:    TOC notified pt needs transportation; spoke w/ pt in room; pt says he can pay for uber back to vehicle; no TOC needs.     Barriers to Discharge: No Barriers Identified   Patient Goals and CMS Choice      Discharge Placement                         Discharge Plan and Services Additional resources added to the After Visit Summary for                                       Social Determinants of Health (SDOH) Interventions SDOH Screenings   Food Insecurity: No Food Insecurity (03/09/2023)  Housing: Low Risk  (03/09/2023)  Transportation Needs: No Transportation Needs (03/09/2023)  Utilities: Not At Risk (03/09/2023)  Depression (PHQ2-9): Low Risk  (03/08/2023)  Tobacco Use: High Risk (03/09/2023)     Readmission Risk Interventions     No data to display

## 2023-03-14 DIAGNOSIS — L0291 Cutaneous abscess, unspecified: Secondary | ICD-10-CM | POA: Diagnosis not present

## 2023-03-22 ENCOUNTER — Encounter: Payer: Self-pay | Admitting: Family Medicine

## 2023-03-22 ENCOUNTER — Ambulatory Visit (INDEPENDENT_AMBULATORY_CARE_PROVIDER_SITE_OTHER): Payer: Medicaid Other | Admitting: Family Medicine

## 2023-03-22 VITALS — BP 121/75 | HR 69 | Temp 98.3°F | Ht 73.0 in | Wt 214.0 lb

## 2023-03-22 DIAGNOSIS — N528 Other male erectile dysfunction: Secondary | ICD-10-CM

## 2023-03-22 DIAGNOSIS — Z23 Encounter for immunization: Secondary | ICD-10-CM | POA: Diagnosis not present

## 2023-03-22 DIAGNOSIS — L0291 Cutaneous abscess, unspecified: Secondary | ICD-10-CM | POA: Diagnosis not present

## 2023-03-22 MED ORDER — SILDENAFIL CITRATE 50 MG PO TABS
50.0000 mg | ORAL_TABLET | Freq: Every day | ORAL | 2 refills | Status: DC | PRN
Start: 2023-03-22 — End: 2023-04-18

## 2023-03-22 NOTE — Patient Instructions (Addendum)
Sildenafil 50 mg once daily as needed 1 hour before sexual activity; may be taken up to 4 hours before sexual activity.  Significant side effects priapism, low BP, hearing loss, vision changes   Come in fasting for next appt with labs.

## 2023-03-22 NOTE — Progress Notes (Signed)
Acute Office Visit  Subjective:  Patient ID: Greg Mason, male    DOB: [birth year], 52 y.o.   MRN: [id number]  Chief Complaint  Patient presents with   Hospitalization Follow-up    Abscess right elbow Pt states still a hard knot inside   HPI Patient is in today for follow up of abscess. States that he is still tired and that he lost 10 lbs during this process. States that there is still a knot present. Reports that he is back working. Denies fever, chills, malaise. He went back to urgent care on 03/14/23 and received a 10 day course of antibiotics (doxy) which he is still taking because he was worried about the knot still being there.   ED  States that he was on mail order medication in the past and it did not work. Would like something as needed. States that it occurs on and off. Started about a year ago.  Denies history of prostate cancer in his family. States that he gets up rarely to urinate at night and stream is sufficient.   ROS As per HPI Objective:  BP 121/75   Pulse 69   Temp 98.3 F (36.8 C)   Ht 6\' 1"  (1.854 m)   Wt 214 lb (97.1 kg)   SpO2 97%   BMI 28.23 kg/m   Physical Exam Constitutional:      General: He is awake. He is not in acute distress.    Appearance: Normal appearance. He is well-developed and well-groomed. He is not ill-appearing, toxic-appearing or diaphoretic.  Cardiovascular:     Rate and Rhythm: Normal rate.     Pulses: Normal pulses.          Radial pulses are 2+ on the right side and 2+ on the left side.       Posterior tibial pulses are 2+ on the right side and 2+ on the left side.     Heart sounds: Normal heart sounds. No murmur heard.    No gallop.  Pulmonary:     Effort: Pulmonary effort is normal. No respiratory distress.     Breath sounds: Normal breath sounds. No stridor. No wheezing, rhonchi or rales.  Musculoskeletal:     Cervical back: Full passive range of motion without pain and neck supple.     Right lower leg: No edema.      Left lower leg: No edema.  Skin:    General: Skin is warm.     Capillary Refill: Capillary refill takes less than 2 seconds.     Findings: Abscess and wound present.     Comments: Healing abscess on right elbow. Closed skin. Elevated, hard nodule, no erythema or tenderness to palpation. Healing scar from procedural laceration.   Neurological:     General: No focal deficit present.     Mental Status: He is alert, oriented to person, place, and time and easily aroused. Mental status is at baseline.     GCS: GCS eye subscore is 4. GCS verbal subscore is 5. GCS motor subscore is 6.     Motor: No weakness.  Psychiatric:        Attention and Perception: Attention and perception normal.        Mood and Affect: Mood and affect normal.        Speech: Speech normal.        Behavior: Behavior normal. Behavior is cooperative.        Thought Content: Thought content normal. Thought content does not  include homicidal or suicidal ideation. Thought content does not include homicidal or suicidal plan.        Cognition and Memory: Cognition and memory normal.        Judgment: Judgment normal.    Assessment & Plan:  1. Abscess Continue antibiotics from urgent care. Continue warm compresses. Return if systemic symptoms return.   2. Other male erectile dysfunction Will start medication as below. Discussed side effects and contraindications. Reviewed Cardiology note from 02/21/23 and verified no contraindications to starting medication as below.  - sildenafil (VIAGRA) 50 MG tablet; Take 1 tablet (50 mg total) by mouth daily as needed for erectile dysfunction.  Dispense: 10 tablet; Refill: 2  Need for hepatitis B booster vaccination -     Heplisav-B (HepB-CPG) Vaccine  Need for Tdap vaccination -     Tdap vaccine greater than or equal to 7yo IM  The above assessment and management plan was discussed with the patient. The patient verbalized understanding of and has agreed to the management plan using  shared-decision making. Patient is aware to call the clinic if they develop any new symptoms or if symptoms fail to improve or worsen. Patient is aware when to return to the clinic for a follow-up visit. Patient educated on when it is appropriate to go to the emergency department.    Return in about 3 months (around 06/22/2023) for Chronic Condition Follow up.  Neale Burly, DNP-FNP [facility] [address] [place], [place] [address] [phone]

## 2023-03-28 ENCOUNTER — Inpatient Hospital Stay: Payer: Medicaid Other | Admitting: Family Medicine

## 2023-04-05 ENCOUNTER — Ambulatory Visit: Payer: Medicaid Other | Admitting: Orthopedic Surgery

## 2023-04-15 ENCOUNTER — Emergency Department (HOSPITAL_COMMUNITY)
Admission: EM | Admit: 2023-04-15 | Discharge: 2023-04-16 | Disposition: A | Discharge status: Home / Self Care | Payer: BLUE CROSS/BLUE SHIELD | Attending: Emergency Medicine | Admitting: Emergency Medicine

## 2023-04-15 ENCOUNTER — Emergency Department (HOSPITAL_COMMUNITY): Payer: BLUE CROSS/BLUE SHIELD

## 2023-04-15 ENCOUNTER — Encounter (HOSPITAL_COMMUNITY): Payer: Self-pay

## 2023-04-15 ENCOUNTER — Other Ambulatory Visit: Payer: Self-pay

## 2023-04-15 DIAGNOSIS — L02416 Cutaneous abscess of left lower limb: Secondary | ICD-10-CM | POA: Diagnosis not present

## 2023-04-15 DIAGNOSIS — M7989 Other specified soft tissue disorders: Secondary | ICD-10-CM | POA: Diagnosis present

## 2023-04-15 NOTE — ED Triage Notes (Signed)
Pt recently had cellulitis of elbow  States that same thing is starting in his LEFT knee started last night.  Denies fevers Swelling and redness.

## 2023-04-16 DIAGNOSIS — L02416 Cutaneous abscess of left lower limb: Secondary | ICD-10-CM | POA: Diagnosis not present

## 2023-04-16 MED ORDER — DOXYCYCLINE HYCLATE 100 MG PO TABS
100.0000 mg | ORAL_TABLET | Freq: Once | ORAL | Status: AC
  Administered 2023-04-16: 100 mg via ORAL

## 2023-04-16 MED ORDER — IBUPROFEN 400 MG PO TABS
400.0000 mg | ORAL_TABLET | Freq: Once | ORAL | Status: DC
  Filled 2023-04-16: qty 1

## 2023-04-16 MED ORDER — ACETAMINOPHEN 325 MG PO TABS
650.0000 mg | ORAL_TABLET | Freq: Once | ORAL | Status: DC
  Filled 2023-04-16: qty 2

## 2023-04-16 MED ORDER — DOXYCYCLINE HYCLATE 100 MG PO TABS
ORAL_TABLET | ORAL | Status: AC
  Filled 2023-04-16: qty 1

## 2023-04-16 MED ORDER — DOXYCYCLINE HYCLATE 100 MG PO CAPS: 100.0000 mg | ORAL_CAPSULE | Freq: Two times a day (BID) | ORAL | 0 refills | Status: DC

## 2023-04-16 MED ORDER — LIDOCAINE-EPINEPHRINE (PF) 2 %-1:200000 IJ SOLN
10.0000 mL | Freq: Once | INTRAMUSCULAR | Status: AC
  Administered 2023-04-16: 10 mL
  Filled 2023-04-16: qty 20

## 2023-04-16 NOTE — ED Provider Notes (Signed)
[place] EMERGENCY DEPARTMENT AT [facility] Provider Note   CSN: [id number] Arrival date & time: 04/15/23  2054     History  Chief Complaint  Patient presents with   Leg Swelling    Marsha Hillman is a 52 y.o. male.  The history is provided by the patient.  He has history of hyperlipidemia and recently was hospitalized for cellulitis of his elbow and comes in with 2-day history of red area and pain in the medial aspect of his left knee which she is concerned is a recurrence of cellulitis.  He denies fever or chills.  It is painful, but he has not taken anything for pain.   Home Medications Prior to Admission medications   Medication Sig Start Date End Date Taking? Authorizing Provider  celecoxib (CELEBREX) 200 MG capsule Take 200 mg by mouth daily.    [provider]  diphenhydrAMINE (BENADRYL) 25 mg capsule Take 25 mg by mouth at bedtime as needed for itching.    [provider]  omeprazole (PRILOSEC) 20 MG capsule Take 1 capsule (20 mg total) by mouth daily. 02/28/23   [name], Aleen Campi, FNP  sildenafil (VIAGRA) 50 MG tablet Take 1 tablet (50 mg total) by mouth daily as needed for erectile dysfunction. 03/22/23   [name], Aleen Campi, FNP      Allergies    Hydrocodone    Review of Systems   Review of Systems  All other systems reviewed and are negative.   Physical Exam Updated Vital Signs BP (!) 144/90   Pulse 83   Temp 98.2 F (36.8 C) (Oral)   Resp 20   Ht 6\' 1"  (1.854 m)   Wt 99.8 kg   SpO2 99%   BMI 29.03 kg/m  Physical Exam Vitals and nursing note reviewed.   52 year old male, resting comfortably and in no acute distress. Vital signs are significant for borderline elevated blood pressure. Oxygen saturation is 99%, which is normal. Head is normocephalic and atraumatic. PERRLA, EOMI. Oropharynx is clear. Neck is nontender and supple without adenopathy. Lungs are clear without rales, wheezes, or rhonchi. Chest is  nontender. Heart has regular rate and rhythm without murmur. Abdomen is soft, flat, nontender. Extremities: There is a 2 cm area of induration with erythema in the medial aspect of the left thigh distally.  There is no fluctuance.  There are no lymphangitic streaks. Neurologic: Mental status is normal, cranial nerves are intact, moves all extremities equally.     ED Results / Procedures / Treatments   Labs (all labs ordered are listed, but only abnormal results are displayed) Labs Reviewed - No data to display  EKG None  Radiology DG Knee 2 Views Left  Result Date: 04/15/2023 CLINICAL DATA:  Redness and swelling around the knee, initial encounter EXAM: LEFT KNEE - 1-2 VIEW COMPARISON:  02/22/2023 FINDINGS: Left knee prosthesis is again seen. No significant joint effusion is noted. No soft tissue abnormality is seen. No acute fracture or dislocation is noted. IMPRESSION: No acute abnormality noted. Electronically Signed   By: Alcide Clever M.D.   On: 04/15/2023 22:07    Procedures Ultrasound ED Soft Tissue  Date/Time: 04/16/2023 12:41 AM  Performed by: Dione Booze, MD Authorized by: Dione Booze, MD   Procedure details:    Indications: localization of abscess     Transverse view:  Visualized   Longitudinal view:  Visualized   Images: archived   Location:    Location: lower extremity  Side:  Left Findings:     abscess present    cellulitis present .Marland KitchenIncision and Drainage  Date/Time: 04/16/2023 12:56 AM  Performed by: Dione Booze, MD Authorized by: Dione Booze, MD   Consent:    Consent obtained:  Verbal   Consent given by:  Patient   Risks, benefits, and alternatives were discussed: yes     Risks discussed:  Bleeding, incomplete drainage and pain   Alternatives discussed:  Alternative treatment Universal protocol:    Procedure explained and questions answered to patient or proxy's satisfaction: yes     Relevant documents present and verified: yes     Required  blood products, implants, devices, and special equipment available: yes     Site/side marked: yes     Immediately prior to procedure, a time out was called: yes     Patient identity confirmed:  Verbally with patient and arm band Location:    Type:  Abscess   Size:  2 cm   Location:  Lower extremity   Lower extremity location:  Leg   Leg location:  L upper leg Pre-procedure details:    Skin preparation:  Antiseptic wash Sedation:    Sedation type:  None Anesthesia:    Anesthesia method:  Local infiltration   Local anesthetic:  Lidocaine 2% WITH epi Procedure type:    Complexity:  Complex Procedure details:    Ultrasound guidance: yes     Needle aspiration: no     Incision types:  Cruciate   Incision depth:  Subcutaneous   Wound management:  Probed and deloculated   Drainage:  Purulent   Drainage amount:  Scant   Wound treatment:  Wound left open   Packing materials:  None Post-procedure details:    Procedure completion:  Tolerated well, no immediate complications     Medications Ordered in ED Medications  ibuprofen (ADVIL) tablet 400 mg (400 mg Oral Patient Refused/Not Given 04/16/23 0046)  acetaminophen (TYLENOL) tablet 650 mg (650 mg Oral Patient Refused/Not Given 04/16/23 0046)  doxycycline (VIBRA-TABS) tablet 100 mg (has no administration in time range)  lidocaine-EPINEPHrine (XYLOCAINE W/EPI) 2 %-1:200000 (PF) injection 10 mL (10 mLs Infiltration Given by Other 04/16/23 7829)    ED Course/ Medical Decision Making/ A&P                             Medical Decision Making Amount and/or Complexity of Data Reviewed Radiology: ordered.  Risk OTC drugs. Prescription drug management.   Cellulitis versus abscess left thigh.  I have performed a limited bedside ultrasound and do see some pockets of pus although no clear abscess cavity.  I have performed incision and drainage and I am discharging him with a prescription for doxycycline.  Advised to use over-the-counter  NSAIDs and acetaminophen as needed for pain.  Final Clinical Impression(s) / ED Diagnoses Final diagnoses:  Abscess of left thigh    Rx / DC Orders ED Discharge Orders          Ordered    doxycycline (VIBRAMYCIN) 100 MG capsule  2 times daily        04/16/23 0058              Dione Booze, MD 04/16/23 [phone]

## 2023-04-16 NOTE — Discharge Instructions (Addendum)
Use warm compresses to promote drainage.  You may take ibuprofen or naproxen as needed for pain.  If you need additional pain relief, you may add acetaminophen.  Combining acetaminophen with ibuprofen or naproxen gives her better pain relief than you get from taking either medication by itself.

## 2023-04-18 ENCOUNTER — Ambulatory Visit (INDEPENDENT_AMBULATORY_CARE_PROVIDER_SITE_OTHER): Payer: BLUE CROSS/BLUE SHIELD | Admitting: Family Medicine

## 2023-04-18 ENCOUNTER — Encounter: Payer: Self-pay | Admitting: Family Medicine

## 2023-04-18 VITALS — BP 134/68 | HR 77 | Temp 98.5°F | Ht 73.0 in | Wt 213.0 lb

## 2023-04-18 DIAGNOSIS — N528 Other male erectile dysfunction: Secondary | ICD-10-CM | POA: Diagnosis not present

## 2023-04-18 DIAGNOSIS — L0291 Cutaneous abscess, unspecified: Secondary | ICD-10-CM

## 2023-04-18 DIAGNOSIS — R030 Elevated blood-pressure reading, without diagnosis of hypertension: Secondary | ICD-10-CM

## 2023-04-18 MED ORDER — SILDENAFIL CITRATE 100 MG PO TABS
100.0000 mg | ORAL_TABLET | Freq: Every day | ORAL | 6 refills | Status: DC | PRN
Start: 2023-04-18 — End: 2023-10-16

## 2023-04-18 MED ORDER — DICLOFENAC SODIUM 75 MG PO TBEC
75.0000 mg | DELAYED_RELEASE_TABLET | Freq: Two times a day (BID) | ORAL | 0 refills | Status: DC
Start: 2023-04-18 — End: 2023-10-16

## 2023-04-18 NOTE — Patient Instructions (Addendum)
Alternate tylenol and diclofenac  Monitoring your BP at home   Your BP was elevated today in office  Please keep a log of your BP at home.  The best time to take BP is 1st thing in the morning after waking.  Sit for 5 minutes with feet flat on the floor, arm at heart level.  Options for BP cuffs are at [employer], [employer], [employer], [facility] & [facility].  We will review measurements at follow up and determine plan for BP management.  If you have access, you can send a message in MyChart with your measurements prior to your follow up appointment.  The brand I recommend to get is Omron (Bronze).

## 2023-04-18 NOTE — Progress Notes (Signed)
Acute Office Visit  Subjective:  Patient ID: Greg Mason, male    DOB: 19-Apr-1971, 52 y.o.   MRN: [id number]  Chief Complaint  Patient presents with   Follow-up    Staph infection left knee   HPI Abscess Patient is in today for skin infection of left thigh. Started on Saturday night and it was only a small bump. States that he was working on Sunday and noticed it had worsened, so he went straight to ED. States that they completed I&D. Started 7 days of doxycycline.  States that since then the site is still painful. Denies fever. Endorses fatigue. Reports remote history of recreational drug use. Denies any recent drug use.   Erectile Dysfunction  Requested refill on viagra. Would like refill. Denies side effects. Denies history of cardiac disease.   ROS As per HPI  Objective:  BP 134/68   Pulse 77   Temp 98.5 F (36.9 C)   Ht 6\' 1"  (1.854 m)   Wt 213 lb (96.6 kg)   SpO2 98%   BMI 28.10 kg/m   Physical Exam Constitutional:      General: He is awake. He is not in acute distress.    Appearance: Normal appearance. He is well-developed and well-groomed. He is not ill-appearing, toxic-appearing or diaphoretic.  Cardiovascular:     Rate and Rhythm: Normal rate and regular rhythm.     Pulses: Normal pulses.          Radial pulses are 2+ on the right side and 2+ on the left side.       Posterior tibial pulses are 2+ on the right side and 2+ on the left side.     Heart sounds: Normal heart sounds. No murmur heard.    No gallop.  Pulmonary:     Effort: Pulmonary effort is normal. No respiratory distress.     Breath sounds: Normal breath sounds. No stridor. No wheezing, rhonchi or rales.  Musculoskeletal:     Cervical back: Full passive range of motion without pain and neck supple.     Right lower leg: No edema.     Left lower leg: No edema.  Skin:    General: Skin is warm.     Capillary Refill: Capillary refill takes less than 2 seconds.     Comments: ~3cm raised,  erythematous abscess with central incisional opening  Neurological:     General: No focal deficit present.     Mental Status: He is oriented to person, place, and time and easily aroused. Mental status is at baseline. He is lethargic.     GCS: GCS eye subscore is 4. GCS verbal subscore is 5. GCS motor subscore is 6.     Motor: No weakness.  Psychiatric:        Attention and Perception: Attention and perception normal.        Mood and Affect: Mood and affect normal.        Speech: Speech normal.        Behavior: Behavior normal. Behavior is cooperative.        Thought Content: Thought content normal. Thought content does not include homicidal or suicidal ideation. Thought content does not include homicidal or suicidal plan.        Cognition and Memory: Cognition and memory normal.        Judgment: Judgment normal.        04/18/2023    4:31 PM 03/22/2023    1:23 PM 03/08/2023    2:35  PM  Depression screen PHQ 2/9  Decreased Interest 0 0 0  Down, Depressed, Hopeless 0 0 0  PHQ - 2 Score 0 0 0  Altered sleeping 0 0 0  Tired, decreased energy 0 0 0  Change in appetite 0 0 0  Feeling bad or failure about yourself  0 0 0  Trouble concentrating 0 0 0  Moving slowly or fidgety/restless 0 0 0  Suicidal thoughts 0 0 0  PHQ-9 Score 0 0 0  Difficult doing work/chores Not difficult at all Not difficult at all Not difficult at all      04/18/2023    4:31 PM 03/22/2023    1:23 PM 03/08/2023    2:35 PM 12/28/2022    3:35 PM  GAD 7 : Generalized Anxiety Score  Nervous, Anxious, on Edge 0 0  0  Control/stop worrying 0 0 0 0  Worry too much - different things 0 0 0 0  Trouble relaxing 0 0 0 0  Restless 0 0 0 0  Easily annoyed or irritable 0 0  0  Afraid - awful might happen 0 0 0 0  Total GAD 7 Score 0 0  0  Anxiety Difficulty Not difficult at all Not difficult at all Not difficult at all Not difficult at all   Assessment & Plan:  1. Abscess Medication as below for pain at site. Instructed  patient to cease use of other NSAIDs. Reviewed GFR from 03/10/23. Patient to complete course of antibiotics. Discussed with patient starting Hibiclens OTC scrubs to prevent recurrent infections. Provided written and verbal information.  - diclofenac (VOLTAREN) 75 MG EC tablet; Take 1 tablet (75 mg total) by mouth 2 (two) times daily.  Dispense: 30 tablet; Refill: 0  2. Other male erectile dysfunction Medication as below.  - sildenafil (VIAGRA) 100 MG tablet; Take 1 tablet (100 mg total) by mouth daily as needed for erectile dysfunction.  Dispense: 10 tablet; Refill: 6  3. Elevated blood pressure reading Elevated BP today in office. Discussed with patient monitoring BP at home. Provided BP log to patient in clinic today. Instructed pt to take BP first thing in the morning after sitting for 5 minutes with feet flat on the floor, arm at heart level. Discussed with patient options for BP cuff at [place], [employer], [facility]. Pt verbalized they were able to obtain BP cuff. Will review measurements with patient at follow up and determine plan for BP management.  Patient stated that he drank an energy drink before coming in.   The above assessment and management plan was discussed with the patient. The patient verbalized understanding of and has agreed to the management plan using shared-decision making. Patient is aware to call the clinic if they develop any new symptoms or if symptoms fail to improve or worsen. Patient is aware when to return to the clinic for a follow-up visit. Patient educated on when it is appropriate to go to the emergency department.   Return in about 1 week (around 04/25/2023) for wound follow up, BP follow up.  Neale Burly, DNP-FNP [facility] [address] [place], [place] [address] [phone]

## 2023-04-24 ENCOUNTER — Ambulatory Visit: Payer: BLUE CROSS/BLUE SHIELD | Admitting: Family Medicine

## 2023-05-03 ENCOUNTER — Ambulatory Visit: Payer: BLUE CROSS/BLUE SHIELD | Admitting: Family Medicine

## 2023-09-05 ENCOUNTER — Encounter: Payer: Self-pay | Admitting: *Deleted

## 2023-10-05 ENCOUNTER — Ambulatory Visit: Payer: BLUE CROSS/BLUE SHIELD | Admitting: Family Medicine

## 2023-10-16 ENCOUNTER — Ambulatory Visit (INDEPENDENT_AMBULATORY_CARE_PROVIDER_SITE_OTHER): Payer: Medicaid Other

## 2023-10-16 ENCOUNTER — Encounter: Payer: Self-pay | Admitting: Family Medicine

## 2023-10-16 ENCOUNTER — Ambulatory Visit: Payer: Medicaid Other | Admitting: Family Medicine

## 2023-10-16 VITALS — BP 134/77 | HR 61 | Temp 98.7°F | Ht 73.0 in | Wt 210.0 lb

## 2023-10-16 DIAGNOSIS — B351 Tinea unguium: Secondary | ICD-10-CM | POA: Diagnosis not present

## 2023-10-16 DIAGNOSIS — M51362 Other intervertebral disc degeneration, lumbar region with discogenic back pain and lower extremity pain: Secondary | ICD-10-CM

## 2023-10-16 DIAGNOSIS — G8929 Other chronic pain: Secondary | ICD-10-CM

## 2023-10-16 DIAGNOSIS — M720 Palmar fascial fibromatosis [Dupuytren]: Secondary | ICD-10-CM

## 2023-10-16 DIAGNOSIS — N528 Other male erectile dysfunction: Secondary | ICD-10-CM

## 2023-10-16 DIAGNOSIS — M545 Low back pain, unspecified: Secondary | ICD-10-CM | POA: Diagnosis not present

## 2023-10-16 MED ORDER — TERBINAFINE HCL 250 MG PO TABS
250.0000 mg | ORAL_TABLET | Freq: Every day | ORAL | 0 refills | Status: DC
Start: 2023-10-16 — End: 2024-06-18

## 2023-10-16 MED ORDER — SILDENAFIL CITRATE 100 MG PO TABS
100.0000 mg | ORAL_TABLET | Freq: Every day | ORAL | 6 refills | Status: DC | PRN
Start: 2023-10-16 — End: 2024-08-15

## 2023-10-16 MED ORDER — PREDNISONE 20 MG PO TABS
40.0000 mg | ORAL_TABLET | Freq: Every day | ORAL | 0 refills | Status: AC
Start: 2023-10-16 — End: 2023-10-21

## 2023-10-16 NOTE — Progress Notes (Signed)
Subjective:  Patient ID: Greg Mason, male    DOB: [birth year], 53 y.o.   MRN: [id number]  Patient Care Team: Ellamae Sia Aleen Campi, FNP as PCP - General (Family Medicine) Mallipeddi, Orion Modest, MD as PCP - Cardiology (Cardiology)   Chief Complaint:  left hand pain, low back pain   HPI: Greg Mason is a 53 y.o. male presenting on 10/16/2023 for left hand pain, low back pain  Low Back Pain  States that he has pain in his lower back. Reports that it is worse in the mornings. Denies saddle anesthesia, change in bowel or urine. Trying icy hot and NSAID. States that they are not working for him. Tried naproxen, aspirin, diclofenac, and ibuprofen. Started 6 months ago getting worse. Established with ortho for knee. 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   ED  States that he is doing well. Denies side effects or adverse reactions. Denies headaches, changes to vision, lightheadedness.   Toenail fungus  States that he previously taking Lamisal for 90 days that did not resolve his symptoms. Reports fungus for years. States that he has tried every at home. States that it is in both feet.   Relevant past medical, surgical, family, and social history reviewed and updated as indicated.  Allergies and medications reviewed and updated. Data reviewed: Chart in Epic.  Past Medical History:  Diagnosis Date   Hyperlipidemia     Past Surgical History:  Procedure Laterality Date   APPENDECTOMY     DG ANKLE LEFT COMPLETE ([facility] HX)     HERNIA REPAIR     KNEE ARTHROSCOPY     Social History   Socioeconomic History   Marital status: Divorced    Spouse name: Not on file   Number of children: Not on file   Years of education: Not on file   Highest education level: Some college, no degree  Occupational History   Not on file  Tobacco Use   Smoking status: Never   Smokeless tobacco: Current    Types: Snuff  Vaping Use   Vaping  status: Never Used  Substance and Sexual Activity   Alcohol use: Never   Drug use: Never   Sexual activity: Not on file  Other Topics Concern   Not on file  Social History Narrative   Not on file   Social Drivers of Health   Financial Resource Strain: Low Risk  (10/15/2023)   Overall Financial Resource Strain (CARDIA)    Difficulty of Paying Living Expenses: Not very hard  Food Insecurity: No Food Insecurity (10/15/2023)   Hunger Vital Sign    Worried About Running Out of Food in the Last Year: Never true    Ran Out of Food in the Last Year: Never true  Transportation Needs: No Transportation Needs (10/15/2023)   PRAPARE - Administrator, Civil Service (Medical): No    Lack of Transportation (Non-Medical): No  Physical Activity: Sufficiently Active (10/15/2023)   Exercise Vital Sign    Days of Exercise per Week: 7 days    Minutes of Exercise per Session: 150+ min  Stress: No Stress Concern Present (10/15/2023)   [employer] of Occupational Health - Occupational Stress Questionnaire    Feeling of Stress : Only a little  Social Connections: Moderately Integrated (10/15/2023)   Social Connection and Isolation Panel [NHANES]    Frequency of Communication with Friends and Family: Twice a week    Frequency of  Social Gatherings with Friends and Family: Once a week    Attends Religious Services: More than 4 times per year    Active Member of [employer] or Organizations: Yes    Attends Engineer, structural: More than 4 times per year    Marital Status: Divorced  Intimate Partner Violence: Not At Risk (03/09/2023)   Humiliation, Afraid, Rape, and Kick questionnaire    Fear of Current or Ex-Partner: No    Emotionally Abused: No    Physically Abused: No    Sexually Abused: No    Outpatient Encounter Medications as of 10/16/2023  Medication Sig   diphenhydrAMINE (BENADRYL) 25 mg capsule Take 25 mg by mouth at bedtime as needed for itching.   sildenafil (VIAGRA) 100 MG  tablet Take 1 tablet (100 mg total) by mouth daily as needed for erectile dysfunction.   [DISCONTINUED] diclofenac (VOLTAREN) 75 MG EC tablet Take 1 tablet (75 mg total) by mouth 2 (two) times daily.   [DISCONTINUED] doxycycline (VIBRAMYCIN) 100 MG capsule Take 1 capsule (100 mg total) by mouth 2 (two) times daily. One po bid x 7 days   [DISCONTINUED] omeprazole (PRILOSEC) 20 MG capsule Take 1 capsule (20 mg total) by mouth daily.   No facility-administered encounter medications on file as of 10/16/2023.    No Known Allergies  Review of Systems As per HPI Objective:  BP 134/77   Pulse 61   Temp 98.7 F (37.1 C)   Ht 6\' 1"  (1.854 m)   Wt 210 lb (95.3 kg)   SpO2 99%   BMI 27.71 kg/m    Wt Readings from Last 3 Encounters:  10/16/23 210 lb (95.3 kg)  04/18/23 213 lb (96.6 kg)  04/15/23 220 lb (99.8 kg)   Physical Exam Constitutional:      General: He is awake. He is not in acute distress.    Appearance: Normal appearance. He is well-developed and well-groomed. He is not ill-appearing, toxic-appearing or diaphoretic.  Cardiovascular:     Rate and Rhythm: Normal rate and regular rhythm.     Pulses: Normal pulses.          Radial pulses are 2+ on the right side and 2+ on the left side.       Posterior tibial pulses are 2+ on the right side and 2+ on the left side.     Heart sounds: Normal heart sounds. No murmur heard.    No gallop.  Pulmonary:     Effort: Pulmonary effort is normal. No respiratory distress.     Breath sounds: Normal breath sounds. No stridor. No wheezing, rhonchi or rales.  Musculoskeletal:     Cervical back: Full passive range of motion without pain and neck supple.     Lumbar back: Tenderness present. No swelling, edema, deformity, signs of trauma, lacerations or spasms. Decreased range of motion. No scoliosis.     Right lower leg: No edema.     Left lower leg: No edema.     Comments: Hard, raised fascia in middle of hand characteristic of Dupuytren's  contracture   Feet:     Right foot:     Toenail Condition: Right toenails are abnormally thick. Fungal disease present.    Left foot:     Toenail Condition: Left toenails are abnormally thick. Fungal disease present. Skin:    General: Skin is warm.     Capillary Refill: Capillary refill takes less than 2 seconds.  Neurological:     General: No focal deficit present.  Mental Status: He is alert, oriented to person, place, and time and easily aroused. Mental status is at baseline.     GCS: GCS eye subscore is 4. GCS verbal subscore is 5. GCS motor subscore is 6.     Motor: No weakness.  Psychiatric:        Attention and Perception: Attention and perception normal.        Mood and Affect: Mood and affect normal.        Speech: Speech normal.        Behavior: Behavior normal. Behavior is cooperative.        Thought Content: Thought content normal. Thought content does not include homicidal or suicidal ideation. Thought content does not include homicidal or suicidal plan.        Cognition and Memory: Cognition and memory normal.        Judgment: Judgment normal.     Results for orders placed or performed during the hospital encounter of 03/08/23  Lactic acid, plasma   Collection Time: 03/08/23  7:52 PM  Result Value Ref Range   Lactic Acid, Venous 0.7 0.5 - 1.9 mmol/L  Comprehensive metabolic panel   Collection Time: 03/08/23  7:52 PM  Result Value Ref Range   Sodium 139 135 - 145 mmol/L   Potassium 4.2 3.5 - 5.1 mmol/L   Chloride 103 98 - 111 mmol/L   CO2 29 22 - 32 mmol/L   Glucose, Bld 85 70 - 99 mg/dL   BUN 17 6 - 20 mg/dL   Creatinine, Ser 9.56 0.61 - 1.24 mg/dL   Calcium 9.5 8.9 - 21.3 mg/dL   Total Protein 7.0 6.5 - 8.1 g/dL   Albumin 4.1 3.5 - 5.0 g/dL   AST 23 15 - 41 U/L   ALT 24 0 - 44 U/L   Alkaline Phosphatase 88 38 - 126 U/L   Total Bilirubin 0.4 0.3 - 1.2 mg/dL   GFR, Estimated >08 >65 mL/min   Anion gap 7 5 - 15  CBC with Differential   Collection Time:  03/08/23  7:52 PM  Result Value Ref Range   WBC 6.0 4.0 - 10.5 K/uL   RBC 4.52 4.22 - 5.81 MIL/uL   Hemoglobin 13.5 13.0 - 17.0 g/dL   HCT 78.4 69.6 - 29.5 %   MCV 87.6 80.0 - 100.0 fL   MCH 29.9 26.0 - 34.0 pg   MCHC 34.1 30.0 - 36.0 g/dL   RDW 28.4 13.2 - 44.0 %   Platelets 276 150 - 400 K/uL   nRBC 0.0 0.0 - 0.2 %   Neutrophils Relative % 57 %   Neutro Abs 3.4 1.7 - 7.7 K/uL   Lymphocytes Relative 33 %   Lymphs Abs 2.0 0.7 - 4.0 K/uL   Monocytes Relative 7 %   Monocytes Absolute 0.4 0.1 - 1.0 K/uL   Eosinophils Relative 2 %   Eosinophils Absolute 0.1 0.0 - 0.5 K/uL   Basophils Relative 1 %   Basophils Absolute 0.1 0.0 - 0.1 K/uL   Immature Granulocytes 0 %   Abs Immature Granulocytes 0.01 0.00 - 0.07 K/uL  HIV Antibody (routine testing w rflx)   Collection Time: 03/09/23  3:47 AM  Result Value Ref Range   HIV Screen 4th Generation wRfx Non Reactive Non Reactive  Basic metabolic panel   Collection Time: 03/09/23  3:47 AM  Result Value Ref Range   Sodium 136 135 - 145 mmol/L   Potassium 3.9 3.5 - 5.1 mmol/L  Chloride 102 98 - 111 mmol/L   CO2 26 22 - 32 mmol/L   Glucose, Bld 99 70 - 99 mg/dL   BUN 14 6 - 20 mg/dL   Creatinine, Ser 4.25 0.61 - 1.24 mg/dL   Calcium 8.9 8.9 - 95.6 mg/dL   GFR, Estimated >38 >75 mL/min   Anion gap 8 5 - 15  CBC   Collection Time: 03/09/23  3:47 AM  Result Value Ref Range   WBC 5.8 4.0 - 10.5 K/uL   RBC 4.63 4.22 - 5.81 MIL/uL   Hemoglobin 13.8 13.0 - 17.0 g/dL   HCT 64.3 32.9 - 51.8 %   MCV 88.8 80.0 - 100.0 fL   MCH 29.8 26.0 - 34.0 pg   MCHC 33.6 30.0 - 36.0 g/dL   RDW 84.1 66.0 - 63.0 %   Platelets 274 150 - 400 K/uL   nRBC 0.0 0.0 - 0.2 %  Basic metabolic panel   Collection Time: 03/10/23  4:10 AM  Result Value Ref Range   Sodium 137 135 - 145 mmol/L   Potassium 3.7 3.5 - 5.1 mmol/L   Chloride 102 98 - 111 mmol/L   CO2 24 22 - 32 mmol/L   Glucose, Bld 118 (H) 70 - 99 mg/dL   BUN 17 6 - 20 mg/dL   Creatinine, Ser 1.60  0.61 - 1.24 mg/dL   Calcium 9.0 8.9 - 10.9 mg/dL   GFR, Estimated >32 >35 mL/min   Anion gap 11 5 - 15  CBC   Collection Time: 03/10/23  4:10 AM  Result Value Ref Range   WBC 9.0 4.0 - 10.5 K/uL   RBC 5.33 4.22 - 5.81 MIL/uL   Hemoglobin 16.0 13.0 - 17.0 g/dL   HCT 57.3 22.0 - 25.4 %   MCV 89.7 80.0 - 100.0 fL   MCH 30.0 26.0 - 34.0 pg   MCHC 33.5 30.0 - 36.0 g/dL   RDW 27.0 62.3 - 76.2 %   Platelets 221 150 - 400 K/uL   nRBC 0.0 0.0 - 0.2 %  Magnesium   Collection Time: 03/10/23  4:10 AM  Result Value Ref Range   Magnesium 1.9 1.7 - 2.4 mg/dL       05/26/5175    1:60 PM 03/22/2023    1:23 PM 03/08/2023    2:35 PM 02/28/2023    3:32 PM 12/28/2022    3:35 PM  Depression screen PHQ 2/9  Decreased Interest 0 0 0 0 0  Down, Depressed, Hopeless 0 0 0 0 0  PHQ - 2 Score 0 0 0 0 0  Altered sleeping 0 0 0  0  Tired, decreased energy 0 0 0  0  Change in appetite 0 0 0  0  Feeling bad or failure about yourself  0 0 0  0  Trouble concentrating 0 0 0  0  Moving slowly or fidgety/restless 0 0 0  0  Suicidal thoughts 0 0 0  0  PHQ-9 Score 0 0 0  0  Difficult doing work/chores Not difficult at all Not difficult at all Not difficult at all  Not difficult at all       04/18/2023    4:31 PM 03/22/2023    1:23 PM 03/08/2023    2:35 PM 12/28/2022    3:35 PM  GAD 7 : Generalized Anxiety Score  Nervous, Anxious, on Edge 0 0  0  Control/stop worrying 0 0 0 0  Worry too much - different things 0 0  0 0  Trouble relaxing 0 0 0 0  Restless 0 0 0 0  Easily annoyed or irritable 0 0  0  Afraid - awful might happen 0 0 0 0  Total GAD 7 Score 0 0  0  Anxiety Difficulty Not difficult at all Not difficult at all Not difficult at all Not difficult at all   Pertinent labs & imaging results that were available during my care of the patient were reviewed by me and considered in my medical decision making.  Assessment & Plan:  Greg Mason" was seen today for left hand pain, low back  pain.  Diagnoses and all orders for this visit:  Chronic midline low back pain without sciatica Imaging as below. Will communicate results to patient once available. Will await results to determine next steps.  Will start medication as below.  -     DG Lumbar Spine 2-3 Views -     predniSONE (DELTASONE) 20 MG tablet; Take 2 tablets (40 mg total) by mouth daily with breakfast for 5 days.  Other male erectile dysfunction Well controlled. Continue current regimen. Refill provided  -     sildenafil (VIAGRA) 100 MG tablet; Take 1 tablet (100 mg total) by mouth daily as needed for erectile dysfunction.  Onychomycosis Discussed with patient that given time of treatment for griseofulvin and reduced efficacy. Would like to start medication as below. Will repeat CMP in 4-6 weeks.  -     terbinafine (LAMISIL) 250 MG tablet; Take 1 tablet (250 mg total) by mouth daily.  Dupuytren contracture of left hand Will start medication as below. Discussed with patient that injection therapy is an option.  -     predniSONE (DELTASONE) 20 MG tablet; Take 2 tablets (40 mg total) by mouth daily with breakfast for 5 days.  Continue all other maintenance medications.  Follow up plan: Return in about 4 weeks (around 11/13/2023) for hand pain.   Continue healthy lifestyle choices, including diet (rich in fruits, vegetables, and lean proteins, and low in salt and simple carbohydrates) and exercise (at least 30 minutes of moderate physical activity daily).  Written and verbal instructions provided   The above assessment and management plan was discussed with the patient. The patient verbalized understanding of and has agreed to the management plan. Patient is aware to call the clinic if they develop any new symptoms or if symptoms persist or worsen. Patient is aware when to return to the clinic for a follow-up visit. Patient educated on when it is appropriate to go to the emergency department.   Neale Burly,  DNP-FNP [facility] [address] [place], [place] [address] [phone]

## 2023-10-19 ENCOUNTER — Encounter: Payer: Self-pay | Admitting: Family Medicine

## 2023-10-19 NOTE — Addendum Note (Signed)
Addended by: Neale Burly on: 10/19/2023 01:03 PM   Modules accepted: Orders

## 2023-10-19 NOTE — Progress Notes (Signed)
Worsening degenerative changes. Will refer to neurosurgeon patient requested.

## 2023-11-05 DIAGNOSIS — M5441 Lumbago with sciatica, right side: Secondary | ICD-10-CM | POA: Diagnosis not present

## 2023-11-05 DIAGNOSIS — M4317 Spondylolisthesis, lumbosacral region: Secondary | ICD-10-CM | POA: Diagnosis not present

## 2023-11-05 DIAGNOSIS — Z6828 Body mass index (BMI) 28.0-28.9, adult: Secondary | ICD-10-CM | POA: Diagnosis not present

## 2023-11-23 ENCOUNTER — Ambulatory Visit: Payer: Self-pay | Admitting: Family Medicine

## 2023-11-23 ENCOUNTER — Telehealth: Payer: BLUE CROSS/BLUE SHIELD | Admitting: Physician Assistant

## 2023-11-23 DIAGNOSIS — M549 Dorsalgia, unspecified: Secondary | ICD-10-CM | POA: Diagnosis not present

## 2023-11-23 MED ORDER — METHYLPREDNISOLONE 4 MG PO TBPK: ORAL_TABLET | ORAL | 0 refills | Status: DC

## 2023-11-23 NOTE — Telephone Encounter (Signed)
  Chief Complaint: Back pain Symptoms: back pain Frequency: Constant Pertinent Negatives: Patient denies Numbness/weakness, loss of bowel/bladder control, n/v, injury, swelling Disposition: [] ED /[] Urgent Care (no appt availability in office) / [x] Appointment(In office/virtual)/ []  [place] [facility]/ [] Home Care/ [] Refused Recommended Disposition /[] [facility]/ []  Follow-up with PCP Additional Notes: Patient called with complaints of lower back pain that is non responsive to temporary treatment. Patient states that he has has chronic pack pain for years but has recently gotten much worse in the last 4-6 months. Patient states pain is constant and aggravated by movement, and occasionally will radiate down hamstrings when changing position. Patient has been taking tramadol and a muscle relaxer with no relief of back pain and states medication makes him too drowsy. Patient states he has also tried OTC NSAID and tylenol with no relief. Patient previously seen for symptoms and has been scheduled an MRI for Mach 12, however provider states that he cannot prescribe any other medication before the MRI. Patient advised by this RN to have f/u with provider. Appt scheduled. Patient advised by this RN to call back with worsening symptoms. Patient verbalized understanding.   Copied from CRM [id number]. Topic: Clinical - Red Word Triage >> Nov 23, 2023 12:48 PM Fuller Mandril wrote: Red Word that prompted transfer to Nurse Triage: Severe pain back and spine. Was prescribed medication from [place] neuro but not able to take. Told to call primary care Reason for Disposition  Back pain is a chronic symptom (recurrent or ongoing AND present > 4 weeks)  Answer Assessment - Initial Assessment Questions 1. ONSET: "When did the pain begin?"      Long time 2. LOCATION: "Where does it hurt?" (upper, mid or lower back)     Lower back 3. SEVERITY: "How bad is the pain?"  (e.g., Scale 1-10; mild, moderate, or  severe)   - MILD (1-3): Doesn't interfere with normal activities.    - MODERATE (4-7): Interferes with normal activities or awakens from sleep.    - SEVERE (8-10): Excruciating pain, unable to do any normal activities.      Severe 4. PATTERN: "Is the pain constant?" (e.g., yes, no; constant, intermittent)      Constant 5. RADIATION: "Does the pain shoot into your legs or somewhere else?"     Sometimes 6. CAUSE:  "What do you think is causing the back pain?"      "I dont know" 7. BACK OVERUSE:  "Any recent lifting of heavy objects, strenuous work or exercise?"     Denies 8. MEDICINES: "What have you taken so far for the pain?" (e.g., nothing, acetaminophen, NSAIDS)     OTC NSAID  9. NEUROLOGIC SYMPTOMS: "Do you have any weakness, numbness, or problems with bowel/bladder control?"     Denies 10. OTHER SYMPTOMS: "Do you have any other symptoms?" (e.g., fever, abdomen pain, burning with urination, blood in urine)       Denies  Taking Tramadol  Protocols used: Back Pain-A-AH

## 2023-11-23 NOTE — Addendum Note (Signed)
 Addended by: [name] on: 11/23/2023 03:54 PM   Modules accepted: Level of Service

## 2023-11-23 NOTE — Patient Instructions (Signed)
  Greg Mason, thank you for joining Greg Meres, PA-C for today's virtual visit.  While this provider is not your primary care provider (PCP), if your PCP is located in our provider database this encounter information will be shared with them immediately following your visit.   [name] MyChart account gives you access to today's visit and all your visits, tests, and labs performed at [facility] " click here if you don't have a [facility] MyChart account or go to [email or web address]  Consent: (Patient) Greg Mason provided verbal consent for this virtual visit at the beginning of the encounter.  Current Medications:  Current Outpatient Medications:    diphenhydrAMINE (BENADRYL) 25 mg capsule, Take 25 mg by mouth at bedtime as needed for itching., Disp: , Rfl:    sildenafil (VIAGRA) 100 MG tablet, Take 1 tablet (100 mg total) by mouth daily as needed for erectile dysfunction., Disp: 10 tablet, Rfl: 6   terbinafine (LAMISIL) 250 MG tablet, Take 1 tablet (250 mg total) by mouth daily., Disp: 84 tablet, Rfl: 0   Medications ordered in this encounter:  No orders of the defined types were placed in this encounter.    *If you need refills on other medications prior to your next appointment, please contact your pharmacy*  Follow-Up: Call back or seek an in-person evaluation if the symptoms worsen or if the condition fails to improve as anticipated.  [name] [phone]  Other Instructions Take the medrol dosepack as directed   Follow up with your primary care provider or neurosurgeon in the next 5-7 days for reassessment  and seek an in person evaluation sooner if your symptoms worsen or fail to imrpove   If you have been instructed to have an in-person evaluation today at a local Urgent Care facility, please use the link below. It will take you to a list of all of our available [facility], including address, phone number and  hours of operation. Please do not delay care.  [facility]  If you or a family member do not have a primary care provider, use the link below to schedule a visit and establish care. When you choose a [facility] primary care physician or advanced practice provider, you gain a long-term partner in health. Find a Primary Care Provider  Learn more about [facility] in-office and virtual care options: [facility] - Get Care Now

## 2023-11-23 NOTE — Progress Notes (Signed)
 Mr. Greg Mason are scheduled for a virtual visit with your provider today.    Just as we do with appointments in the office, we must obtain your consent to participate.  Your consent will be active for this visit and any virtual visit you may have with one of our providers in the next 365 days.    If you have a MyChart account, I can also send a copy of this consent to you electronically.  All virtual visits are billed to your insurance company just like a traditional visit in the office.  As this is a virtual visit, video technology does not allow for your provider to perform a traditional examination.  This may limit your provider's ability to fully assess your condition.  If your provider identifies any concerns that need to be evaluated in person or the need to arrange testing such as labs, EKG, etc, we will make arrangements to do so.    Although advances in technology are sophisticated, we cannot ensure that it will always work on either your end or our end.  If the connection with a video visit is poor, we may have to switch to a telephone visit.  With either a video or telephone visit, we are not always able to ensure that we have a secure connection.   I need to obtain your verbal consent now.   Are you willing to proceed with your visit today?   Greg Mason has provided verbal consent on 11/23/2023 for a virtual visit (video or telephone).   [name], [place] 11/23/2023  2:56 PM   Date:  11/23/2023   ID:  Greg Mason, DOB 29-Oct-1970, MRN [id number]  Patient Location: Home Provider Location: Home Office   Participants: Patient and Provider for Visit and Wrap up  Method of visit: Video  Location of Patient: Home Location of Provider: Home Office Consent was obtain for visit over the video. Services rendered by provider: Visit was performed via video  A video enabled telemedicine application was used and I verified that I am speaking with the correct person using two  identifiers.  PCP:  Greg Senate, FNP   Chief Complaint:  back pain  History of Present Illness:    Greg Mason is a 53 y.o. male with history as stated below. Presents video telehealth for an acute care visit  Pt presents for eval of back pain that has been ongoing for the last months. Pain located to the lower back with intermittent radiation of pain down the legs. Denies numbness/weakness. Denies urinary or fecal incontinence. He is still ambulatory. He denies IVDU. Denies h/o CA.   Pt states he was referred to a neurosurgeon, Dr. Franky Macho who ordered an MRI on 12/05/22. He states he was started on pain medications and muscle relaxers which made him sleepy. He contacted the nsg team and was told that Dr. Franky Macho could not prescribe any additional medications until his MRI was completed. He was referred back to PCP who was unable to see him today.   Past Medical, Surgical, Social History, Allergies, and Medications have been Reviewed.  Past Medical History:  Diagnosis Date   Hyperlipidemia     No outpatient medications have been marked as taking for the 11/23/23 encounter (Video Visit) with [facility] PROVIDER.     Allergies:   Patient has no known allergies.   ROS See HPI for history of present illness.  Physical Exam Constitutional:      Appearance: Normal appearance. He is not ill-appearing.  Neurological:     Mental Status: He is alert.              MDM: Pt with back pain that is acute on chronic. No red flag signs or symptoms. Is awaiting MRI with NSG. Will give rx for medrol dosepack. Advised him to half his dose of flexeril and to avoid taking flexeril at the same time as tramadol. Advised otc lidocaine patches. Advised pcp f/u for pt referral if that is felt to be appropriate.    Tests Ordered: No orders of the defined types were placed in this encounter.   Medication Changes: No orders of the defined types were placed in this  encounter.    Disposition:  Follow up  Signed, [name], PA-C  11/23/2023 2:56 PM

## 2023-11-24 ENCOUNTER — Emergency Department (HOSPITAL_COMMUNITY)

## 2023-11-24 ENCOUNTER — Encounter (HOSPITAL_COMMUNITY): Payer: Self-pay | Admitting: Emergency Medicine

## 2023-11-24 ENCOUNTER — Emergency Department (HOSPITAL_COMMUNITY)
Admission: EM | Admit: 2023-11-24 | Discharge: 2023-11-24 | Disposition: A | Discharge status: Home / Self Care | Attending: Emergency Medicine | Admitting: Emergency Medicine

## 2023-11-24 DIAGNOSIS — L0390 Cellulitis, unspecified: Secondary | ICD-10-CM

## 2023-11-24 DIAGNOSIS — S62327A Displaced fracture of shaft of fifth metacarpal bone, left hand, initial encounter for closed fracture: Secondary | ICD-10-CM | POA: Diagnosis not present

## 2023-11-24 DIAGNOSIS — M7989 Other specified soft tissue disorders: Secondary | ICD-10-CM | POA: Diagnosis present

## 2023-11-24 DIAGNOSIS — L03114 Cellulitis of left upper limb: Secondary | ICD-10-CM | POA: Diagnosis not present

## 2023-11-24 DIAGNOSIS — S61209A Unspecified open wound of unspecified finger without damage to nail, initial encounter: Secondary | ICD-10-CM | POA: Diagnosis not present

## 2023-11-24 DIAGNOSIS — M1812 Unilateral primary osteoarthritis of first carpometacarpal joint, left hand: Secondary | ICD-10-CM | POA: Diagnosis not present

## 2023-11-24 LAB — COMPREHENSIVE METABOLIC PANEL
ALT: 27 U/L (ref 0–44)
AST: 28 U/L (ref 15–41)
Albumin: 3.7 g/dL (ref 3.5–5.0)
Alkaline Phosphatase: 81 U/L (ref 38–126)
Anion gap: 13 (ref 5–15)
BUN: 17 mg/dL (ref 6–20)
CO2: 26 mmol/L (ref 22–32)
Calcium: 9.6 mg/dL (ref 8.9–10.3)
Chloride: 104 mmol/L (ref 98–111)
Creatinine, Ser: 1.24 mg/dL (ref 0.61–1.24)
GFR, Estimated: >60 mL/min (ref >60)
Glucose, Bld: 125 mg/dL — ABNORMAL HIGH (ref 70–99)
Potassium: 4 mmol/L (ref 3.5–5.1)
Sodium: 143 mmol/L (ref 135–145)
Total Bilirubin: 0.5 mg/dL (ref 0.0–1.2)
Total Protein: 6.7 g/dL (ref 6.5–8.1)

## 2023-11-24 LAB — CBC
HCT: 44.3 % (ref 39.0–52.0)
Hemoglobin: 15.1 g/dL (ref 13.0–17.0)
MCH: 30.9 pg (ref 26.0–34.0)
MCHC: 34.1 g/dL (ref 30.0–36.0)
MCV: 90.8 fL (ref 80.0–100.0)
Platelets: 270 10*3/uL (ref 150–400)
RBC: 4.88 MIL/uL (ref 4.22–5.81)
RDW: 12.3 % (ref 11.5–15.5)
WBC: 10.4 10*3/uL (ref 4.0–10.5)
nRBC: 0 % (ref 0.0–0.2)

## 2023-11-24 MED ORDER — CEPHALEXIN 500 MG PO CAPS: 500.0000 mg | ORAL_CAPSULE | Freq: Three times a day (TID) | ORAL | 0 refills | Status: DC

## 2023-11-24 MED ORDER — OXYCODONE HCL 5 MG PO TABS: 5.0000 mg | ORAL_TABLET | Freq: Four times a day (QID) | ORAL | 0 refills | Status: DC | PRN

## 2023-11-24 MED ORDER — ACETAMINOPHEN 500 MG PO TABS
1000.0000 mg | ORAL_TABLET | Freq: Once | ORAL | Status: AC
  Administered 2023-11-24: 1000 mg via ORAL
  Filled 2023-11-24: qty 2

## 2023-11-24 MED ORDER — CEFTRIAXONE SODIUM 1 G IJ SOLR
1.0000 g | Freq: Once | INTRAMUSCULAR | Status: AC
  Administered 2023-11-24: 1 g via INTRAMUSCULAR
  Filled 2023-11-24: qty 10

## 2023-11-24 MED ORDER — OXYCODONE HCL 5 MG PO TABS
5.0000 mg | ORAL_TABLET | Freq: Once | ORAL | Status: AC
  Administered 2023-11-24: 5 mg via ORAL
  Filled 2023-11-24: qty 1

## 2023-11-24 MED ORDER — DOXYCYCLINE HYCLATE 100 MG PO CAPS: 100.0000 mg | ORAL_CAPSULE | Freq: Two times a day (BID) | ORAL | 0 refills | Status: DC

## 2023-11-24 NOTE — ED Provider Notes (Signed)
 [place] EMERGENCY DEPARTMENT AT [facility] Provider Note   CSN: [id number] Arrival date & time: 11/24/23  1524     History No chief complaint on file.   HPI Greg Mason is a 53 y.o. male presenting for hand swelling and redness.   Patient's recorded medical, surgical, social, medication list and allergies were reviewed in the Snapshot window as part of the initial history.   Review of Systems   Review of Systems  Constitutional:  Negative for chills and fever.  HENT:  Negative for ear pain and sore throat.   Eyes:  Negative for pain and visual disturbance.  Respiratory:  Negative for cough and shortness of breath.   Cardiovascular:  Negative for chest pain and palpitations.  Gastrointestinal:  Negative for abdominal pain and vomiting.  Genitourinary:  Negative for dysuria and hematuria.  Musculoskeletal:  Negative for arthralgias and back pain.  Skin:  Positive for color change and rash.  Neurological:  Negative for seizures and syncope.  All other systems reviewed and are negative.   Physical Exam Updated Vital Signs BP (!) 151/86 (BP Location: Left Arm)   Pulse 72   Temp 98.1 F (36.7 C)   Resp 15   SpO2 100%  Physical Exam Vitals and nursing note reviewed.  Constitutional:      General: He is not in acute distress.    Appearance: He is well-developed.  HENT:     Head: Normocephalic and atraumatic.  Eyes:     Conjunctiva/sclera: Conjunctivae normal.  Cardiovascular:     Rate and Rhythm: Normal rate and regular rhythm.  Pulmonary:     Effort: Pulmonary effort is normal. No respiratory distress.  Abdominal:     General: Abdomen is flat. There is no distension.  Musculoskeletal:        General: No swelling or deformity.  Skin:    General: Skin is warm and dry.     Capillary Refill: Capillary refill takes less than 2 seconds.  Neurological:     Mental Status: He is alert and oriented to person, place, and time. Mental status is at baseline.       ED Course/ Medical Decision Making/ A&P    Procedures Procedures   Medications Ordered in ED Medications  cefTRIAXone (ROCEPHIN) injection 1 g (1 g Intramuscular Given 11/24/23 1754)  oxyCODONE (Oxy IR/ROXICODONE) immediate release tablet 5 mg (5 mg Oral Given 11/24/23 1756)  acetaminophen (TYLENOL) tablet 1,000 mg (1,000 mg Oral Given 11/24/23 1754)    Medical Decision Making:   This is a 53 year old male presenting with recurrent left hand redness and swelling. History cellulitis of the space x 2 last year after a injury. Has come up over the last 4 days.  He drained his own abscess at home. Typically responsive to doxycycline.  Follows with hand surgery in the outpatient setting.  Required admission for borderline necrotizing case last year.  States he came in much earlier this time.  Lab work shows no signs of systemic illness.  Vital signs within normal limits.  Discussed with patient.  Will treated with IM Rocephin to start treatment transition to p.o. doxycycline and Keflex for strep/staph coverage.   Disposition:  I have considered need for hospitalization, however, considering all of the above, I believe this patient is stable for discharge at this time.  Patient/family educated about specific return precautions for given chief complaint and symptoms.  Patient/family educated about follow-up with PCP.     Patient/family expressed understanding of return precautions  and need for follow-up. Patient spoken to regarding all imaging and laboratory results and appropriate follow up for these results. All education provided in verbal form with additional information in written form. Time was allowed for answering of patient questions. Patient discharged.    Emergency Department Medication Summary:   Clinical Impression:  1. Cellulitis, unspecified cellulitis site      Data Unavailable   Final Clinical Impression(s) / ED Diagnoses Final diagnoses:  Cellulitis, unspecified  cellulitis site    Rx / DC Orders ED Discharge Orders          Ordered    cephALEXin (KEFLEX) 500 MG capsule  3 times daily        11/24/23 1719    doxycycline (VIBRAMYCIN) 100 MG capsule  2 times daily        11/24/23 1719    oxyCODONE (ROXICODONE) 5 MG immediate release tablet  Every 6 hours PRN        11/24/23 1719              Glyn Ade, MD 11/24/23 1812

## 2023-11-24 NOTE — ED Triage Notes (Signed)
 Pt here from home redness and swelling the left hand noticed a spot on his ring finger Thursday and swelling started last night

## 2023-11-26 ENCOUNTER — Ambulatory Visit: Payer: Self-pay | Admitting: Family Medicine

## 2023-11-26 ENCOUNTER — Encounter: Payer: Self-pay | Admitting: Family

## 2023-11-26 ENCOUNTER — Ambulatory Visit: Admitting: Family

## 2023-11-26 VITALS — BP 147/80 | HR 63 | Temp 97.5°F | Ht 73.0 in | Wt 212.4 lb

## 2023-11-26 DIAGNOSIS — L03114 Cellulitis of left upper limb: Secondary | ICD-10-CM | POA: Diagnosis not present

## 2023-11-26 DIAGNOSIS — L03119 Cellulitis of unspecified part of limb: Secondary | ICD-10-CM | POA: Diagnosis not present

## 2023-11-26 MED ORDER — CEFTRIAXONE SODIUM 1 G IJ SOLR
1.0000 g | Freq: Once | INTRAMUSCULAR | Status: AC
Start: 2023-11-26 — End: 2023-11-26
  Administered 2023-11-26: 1 g via INTRAMUSCULAR

## 2023-11-26 NOTE — Telephone Encounter (Signed)
  Communication  Red Word that prompted transfer to Nurse Triage: Hand infected/swollen - seen at ED- possible cellulitis. Advised if not better by today to be seen by pcp.                Chief Complaint: Seen in ED 11/24/23 WITH cellulitis left hand, started on antibiotic. States hand is worse, more swollen. Symptoms: Above Frequency: Last week Pertinent Negatives: Patient denies fever Disposition: [] ED /[] Urgent Care (no appt availability in office) / [x] Appointment(In office/virtual)/ []  [facility]/ [] Home Care/ [] Refused Recommended Disposition /[] [facility]/ []  Follow-up with PCP Additional Notes: Agrees with appointment.  Reason for Disposition  [1] Looks infected (spreading redness, pus) AND [2] large red area (> 2 in. or 5 cm)  Answer Assessment - Initial Assessment Questions 1. ONSET: "When did the pain start?"     Last week 2. LOCATION: "Where is the pain located?"     Left ring finger and hand 3. PAIN: "How bad is the pain?" (Scale 1-10; or mild, moderate, severe)   - MILD (1-3): doesn't interfere with normal activities   - MODERATE (4-7): interferes with normal activities (e.g., work or school) or awakens from sleep   - SEVERE (8-10): excruciating pain, unable to use hand at all     Now - Mild 4. WORK OR EXERCISE: "Has there been any recent work or exercise that involved this part (i.e., hand or wrist) of the body?"     No 5. CAUSE: "What do you think is causing the pain?"     Cellulitis 6. AGGRAVATING FACTORS: "What makes the pain worse?" (e.g., using computer)     No 7. OTHER SYMPTOMS: "Do you have any other symptoms?" (e.g., neck pain, swelling, rash, numbness, fever)     Swelling 8. PREGNANCY: "Is there any chance you are pregnant?" "When was your last menstrual period?"     N/a  Protocols used: Hand and Wrist Pain-A-AH

## 2023-11-26 NOTE — Progress Notes (Signed)
 Subjective:    Patient ID: Greg Mason, male    DOB: [birth year], 53 y.o.   MRN: [id number]  Chief Complaint  Patient presents with   Cellulitis    On left hand     HPI  Pt presents to the office today for hospital follow up . Went to the ED on 11/24/23 with left ring finger swelling that extended to his hand. He was diagnosed with cellulitis. He was given Rocephin 1 mg and discharged with keflex 500 mg TID and doxycyline 100 mg BID.  Reports  his redness and swelling is not improved and may be slightly worse.   Review of Systems  All other systems reviewed and are negative.   Social History   Socioeconomic History   Marital status: Divorced    Spouse name: Not on file   Number of children: Not on file   Years of education: Not on file   Highest education level: Some college, no degree  Occupational History   Not on file  Tobacco Use   Smoking status: Never   Smokeless tobacco: Current    Types: Snuff  Vaping Use   Vaping status: Never Used  Substance and Sexual Activity   Alcohol use: Never   Drug use: Never   Sexual activity: Not on file  Other Topics Concern   Not on file  Social History Narrative   Not on file   Social Drivers of Health   Financial Resource Strain: Low Risk  (10/15/2023)   Overall Financial Resource Strain (CARDIA)    Difficulty of Paying Living Expenses: Not very hard  Food Insecurity: No Food Insecurity (10/15/2023)   Hunger Vital Sign    Worried About Running Out of Food in the Last Year: Never true    Ran Out of Food in the Last Year: Never true  Transportation Needs: No Transportation Needs (10/15/2023)   PRAPARE - Administrator, Civil Service (Medical): No    Lack of Transportation (Non-Medical): No  Physical Activity: Sufficiently Active (10/15/2023)   Exercise Vital Sign    Days of Exercise per Week: 7 days    Minutes of Exercise per Session: 150+ min  Stress: No Stress Concern Present (10/15/2023)   [employer]  of Occupational Health - Occupational Stress Questionnaire    Feeling of Stress : Only a little  Social Connections: Moderately Integrated (10/15/2023)   Social Connection and Isolation Panel [NHANES]    Frequency of Communication with Friends and Family: Twice a week    Frequency of Social Gatherings with Friends and Family: Once a week    Attends Religious Services: More than 4 times per year    Active Member of [employer] or Organizations: Yes    Attends Engineer, structural: More than 4 times per year    Marital Status: Divorced   Family History  Problem Relation Age of Onset   Diabetes Mother    Seizures Sister         Objective:   Physical Exam Vitals reviewed.  Constitutional:      General: He is not in acute distress.    Appearance: He is well-developed.  HENT:     Head: Normocephalic.     Right Ear: Tympanic membrane normal.     Left Ear: Tympanic membrane normal.  Eyes:     General:        Right eye: No discharge.        Left eye: No discharge.  Pupils: Pupils are equal, round, and reactive to light.  Neck:     Thyroid: No thyromegaly.  Cardiovascular:     Rate and Rhythm: Normal rate and regular rhythm.     Heart sounds: Normal heart sounds. No murmur heard. Pulmonary:     Effort: Pulmonary effort is normal. No respiratory distress.     Breath sounds: Normal breath sounds. No wheezing.  Abdominal:     General: Bowel sounds are normal. There is no distension.     Palpations: Abdomen is soft.     Tenderness: There is no abdominal tenderness.  Musculoskeletal:        General: No tenderness. Normal range of motion.     Cervical back: Normal range of motion and neck supple.  Skin:    General: Skin is warm and dry.     Findings: Erythema present. No rash.          Comments: Left ring finger erythemas with a small scab. Erythemas, warmth spreading down hand into wrist.   Neurological:     Mental Status: He is alert and oriented to person, place, and  time.     Cranial Nerves: No cranial nerve deficit.     Deep Tendon Reflexes: Reflexes are normal and symmetric.  Psychiatric:        Behavior: Behavior normal.        Thought Content: Thought content normal.        Judgment: Judgment normal.       BP (!) 147/80   Pulse 63   Temp (!) 97.5 F (36.4 C) (Temporal)   Ht 6\' 1"  (1.854 m)   Wt 212 lb 6.4 oz (96.3 kg)   BMI 28.02 kg/m      Assessment & Plan:  Greg Mason comes in today with chief complaint of Cellulitis (On left hand )   Diagnosis and orders addressed:  1. Cellulitis of hand (Primary) Hospital notes reviewed  Will give Rocephin  Continue Keflex and Doxycyline  Area marked, let us know if redness worsens or do not improve Wound culture pending  Follow up with PCP  - cefTRIAXone (ROCEPHIN) injection 1 g - Anaerobic and Aerobic Culture     Jannifer Rodney, FNP

## 2023-11-26 NOTE — Patient Instructions (Signed)

## 2023-11-26 NOTE — Telephone Encounter (Signed)
 Appt today

## 2023-11-27 ENCOUNTER — Ambulatory Visit: Payer: Self-pay | Admitting: Family Medicine

## 2023-11-27 ENCOUNTER — Ambulatory Visit: Admitting: Nurse Practitioner

## 2023-11-27 ENCOUNTER — Encounter: Payer: Self-pay | Admitting: Nurse Practitioner

## 2023-11-27 VITALS — BP 145/92 | HR 76 | Temp 97.8°F | Ht 73.0 in | Wt 209.0 lb

## 2023-11-27 DIAGNOSIS — L03119 Cellulitis of unspecified part of limb: Secondary | ICD-10-CM

## 2023-11-27 NOTE — Telephone Encounter (Signed)
 Copied from CRM [id number]. Topic: Clinical - Red Word Triage >> Nov 27, 2023  8:10 AM Greg Mason A wrote: [facility] that prompted transfer to Nurse Triage: Patient was seen at the office yesterday for had infection hospital follow up and was told by provider if the redness gets past it to call.   Chief Complaint: Right Hand Swelling Symptoms: Erythema, Swelling, Pain Frequency: Ongoing Pertinent Negatives: Patient denies fever Disposition: [] ED /[] Urgent Care (no appt availability in office) / [x] Appointment(In office/virtual)/ []  [facility]/ [] Home Care/ [] Refused Recommended Disposition /[] [facility]/ []  Follow-up with PCP Additional Notes: WH was previously seen in office yesterday for cellulitis and saw C. Lendon Colonel, NP. The patient states he was told to call back if the redness extended past the line drawn and it has. The patient denies a fever at this time, but reports the swelling encompasses the top of his entire hand. In addition, his hand is painful to the touch. In office appointment made for this morning with another provider in office due to symptom presentation.   Reason for Disposition  [1] Red area or streak [2] large (> 2 in. or 5 cm)  Answer Assessment - Initial Assessment Questions 1. ONSET: "When did the swelling start?" (e.g., minutes, hours, days)     Ongoing 2. LOCATION: "What part of the hand is swollen?"  "Are both hands swollen or just one hand?"     Left Hand, Top Of 3. SEVERITY: "How bad is the swelling?" (e.g., localized; mild, moderate, severe)   - BALL OR LUMP: small ball or lump   - LOCALIZED: puffy or swollen area or patch of skin   - JOINT SWELLING: swelling of a joint   - MILD: puffiness or mild swelling of fingers or hand   - MODERATE: fingers and hand are swollen   - SEVERE: swelling of entire hand and up into forearm     Moderate  4. REDNESS: "Does the swelling look red or infected?"     Yes  5. PAIN: "Is the swelling painful to  touch?" If Yes, ask: "How painful is it?"   (Scale 1-10; mild, moderate or severe)      Yes, 8  6. FEVER: "Do you have a fever?" If Yes, ask: "What is it, how was it measured, and when did it start?"      No  7. CAUSE: "What do you think is causing the hand swelling?" (e.g., heat, insect bite, pregnancy, recent injury)     Recent Diagnosis of Cellulitis  8. MEDICAL HISTORY: "Do you have a history of heart failure, kidney disease, liver failure, or cancer?"     Recent Diagnosis of Cellulitis   10. OTHER SYMPTOMS: "Do you have any other symptoms?" (e.g., blurred vision, difficulty breathing, headache)       No  Protocols used: Hand Swelling-A-AH

## 2023-11-27 NOTE — Progress Notes (Signed)
   Subjective:    Patient ID: Greg Mason, male    DOB: 02-Jan-1971, 53 y.o.   MRN: [id number]   Chief Complaint: hand swelling  Patient comes in c/o left hand swelling. Started out with a small pimple like area at base of left ring finger. Swelling has been gradually increasing. He went to urgent care on Saturday and was given rocephin, doxycycline and keflex. No improvement. Saw C. Hawks yesterday and was given another rocephin shot. He does not think it is any better.      Patient Active Problem List   Diagnosis Date Noted   Abscess 03/14/2023   Cellulitis of right arm 03/08/2023   Non-cardiac chest pain 02/21/2023       Review of Systems  Constitutional:  Negative for diaphoresis.  Eyes:  Negative for pain.  Respiratory:  Negative for shortness of breath.   Cardiovascular:  Negative for chest pain, palpitations and leg swelling.  Gastrointestinal:  Negative for abdominal pain.  Endocrine: Negative for polydipsia.  Skin:  Negative for rash.  Neurological:  Negative for dizziness, weakness and headaches.  Hematological:  Does not bruise/bleed easily.  All other systems reviewed and are negative.      Objective:   Physical Exam Constitutional:      Appearance: Normal appearance.  Cardiovascular:     Rate and Rhythm: Normal rate and regular rhythm.     Heart sounds: Normal heart sounds.  Pulmonary:     Breath sounds: Normal breath sounds.  Skin:    Comments: Left hand still edematous but erythema seems better  Neurological:     General: No focal deficit present.     Mental Status: He is alert and oriented to person, place, and time.  Psychiatric:        Mood and Affect: Mood normal.   BP (!) 145/92   Pulse 76   Temp 97.8 F (36.6 C) (Temporal)   Ht 6\' 1"  (1.854 m)   Wt 209 lb (94.8 kg)   SpO2 96%   BMI 27.57 kg/m          Assessment & Plan:   [name] in today with chief complaint of No chief complaint on file.   1. Cellulitis of hand  (Primary) Epsom salt soaks Ice Elevate Continue antibiotics Culture results still pending If no better tomorrow needs to go to the hospital    The above assessment and management plan was discussed with the patient. The patient verbalized understanding of and has agreed to the management plan. Patient is aware to call the clinic if symptoms persist or worsen. Patient is aware when to return to the clinic for a follow-up visit. Patient educated on when it is appropriate to go to the emergency department.   Mary-Margaret Daphine Deutscher, FNP

## 2023-11-27 NOTE — Telephone Encounter (Signed)
 Appt today

## 2023-11-29 ENCOUNTER — Observation Stay (HOSPITAL_COMMUNITY)
Admission: EM | Admit: 2023-11-29 | Discharge: 2023-12-01 | Disposition: A | Discharge status: Home / Self Care | Attending: Internal Medicine | Admitting: Internal Medicine

## 2023-11-29 ENCOUNTER — Other Ambulatory Visit: Payer: Self-pay

## 2023-11-29 DIAGNOSIS — L03114 Cellulitis of left upper limb: Principal | ICD-10-CM | POA: Insufficient documentation

## 2023-11-29 DIAGNOSIS — Z79899 Other long term (current) drug therapy: Secondary | ICD-10-CM | POA: Insufficient documentation

## 2023-11-29 DIAGNOSIS — M79642 Pain in left hand: Secondary | ICD-10-CM | POA: Diagnosis present

## 2023-11-29 DIAGNOSIS — L03119 Cellulitis of unspecified part of limb: Principal | ICD-10-CM

## 2023-11-29 MED ORDER — SODIUM CHLORIDE 0.9 % IV SOLN
2.0000 g | Freq: Once | INTRAVENOUS | Status: AC
  Administered 2023-11-30: 2 g via INTRAVENOUS
  Filled 2023-11-29: qty 12.5

## 2023-11-29 MED ORDER — MORPHINE SULFATE (PF) 4 MG/ML IV SOLN
4.0000 mg | Freq: Once | INTRAVENOUS | Status: AC
  Administered 2023-11-30: 4 mg via INTRAVENOUS
  Filled 2023-11-29: qty 1

## 2023-11-29 MED ORDER — ONDANSETRON HCL 4 MG/2ML IJ SOLN
4.0000 mg | Freq: Once | INTRAMUSCULAR | Status: AC
  Administered 2023-11-30: 4 mg via INTRAVENOUS
  Filled 2023-11-29: qty 2

## 2023-11-29 MED ORDER — KETOROLAC TROMETHAMINE 15 MG/ML IJ SOLN
15.0000 mg | Freq: Once | INTRAMUSCULAR | Status: AC
  Administered 2023-11-30: 15 mg via INTRAMUSCULAR
  Filled 2023-11-29: qty 1

## 2023-11-29 NOTE — ED Triage Notes (Signed)
 Pt from home complaining of hand pain and swelling, states that he was seen at [facility] and PCP, doesn't know if its cellulitis or a bite. Was given antibiotics but nothing has not helped. Pt states the redness is spreading. Hand is stiff and pt can move fingers but causes pain.

## 2023-11-30 ENCOUNTER — Inpatient Hospital Stay (HOSPITAL_COMMUNITY)

## 2023-11-30 DIAGNOSIS — L03114 Cellulitis of left upper limb: Secondary | ICD-10-CM

## 2023-11-30 DIAGNOSIS — M7989 Other specified soft tissue disorders: Secondary | ICD-10-CM | POA: Diagnosis not present

## 2023-11-30 DIAGNOSIS — M79642 Pain in left hand: Secondary | ICD-10-CM | POA: Diagnosis not present

## 2023-11-30 DIAGNOSIS — M19042 Primary osteoarthritis, left hand: Secondary | ICD-10-CM | POA: Diagnosis not present

## 2023-11-30 DIAGNOSIS — M1812 Unilateral primary osteoarthritis of first carpometacarpal joint, left hand: Secondary | ICD-10-CM | POA: Diagnosis not present

## 2023-11-30 LAB — CBC WITH DIFFERENTIAL/PLATELET
Abs Immature Granulocytes: 0.02 10*3/uL (ref 0.00–0.07)
Basophils Absolute: 0.1 10*3/uL (ref 0.0–0.1)
Basophils Relative: 1 %
Eosinophils Absolute: 0.1 10*3/uL (ref 0.0–0.5)
Eosinophils Relative: 2 %
HCT: 43.9 % (ref 39.0–52.0)
Hemoglobin: 15.2 g/dL (ref 13.0–17.0)
Immature Granulocytes: 0 %
Lymphocytes Relative: 31 %
Lymphs Abs: 2.2 10*3/uL (ref 0.7–4.0)
MCH: 30.2 pg (ref 26.0–34.0)
MCHC: 34.6 g/dL (ref 30.0–36.0)
MCV: 87.3 fL (ref 80.0–100.0)
Monocytes Absolute: 0.7 10*3/uL (ref 0.1–1.0)
Monocytes Relative: 9 %
Neutro Abs: 4 10*3/uL (ref 1.7–7.7)
Neutrophils Relative %: 57 %
Platelets: 266 10*3/uL (ref 150–400)
RBC: 5.03 MIL/uL (ref 4.22–5.81)
RDW: 12.2 % (ref 11.5–15.5)
WBC: 7 10*3/uL (ref 4.0–10.5)
nRBC: 0 % (ref 0.0–0.2)

## 2023-11-30 LAB — COMPREHENSIVE METABOLIC PANEL
ALT: 34 U/L (ref 0–44)
AST: 34 U/L (ref 15–41)
Albumin: 4 g/dL (ref 3.5–5.0)
Alkaline Phosphatase: 91 U/L (ref 38–126)
Anion gap: 10 (ref 5–15)
BUN: 28 mg/dL — ABNORMAL HIGH (ref 6–20)
CO2: 26 mmol/L (ref 22–32)
Calcium: 9.3 mg/dL (ref 8.9–10.3)
Chloride: 102 mmol/L (ref 98–111)
Creatinine, Ser: 1.01 mg/dL (ref 0.61–1.24)
GFR, Estimated: >60 mL/min (ref >60)
Glucose, Bld: 106 mg/dL — ABNORMAL HIGH (ref 70–99)
Potassium: 4 mmol/L (ref 3.5–5.1)
Sodium: 138 mmol/L (ref 135–145)
Total Bilirubin: 0.7 mg/dL (ref 0.0–1.2)
Total Protein: 7.1 g/dL (ref 6.5–8.1)

## 2023-11-30 LAB — MRSA NEXT GEN BY PCR, NASAL: MRSA by PCR Next Gen: NOT DETECTED

## 2023-11-30 LAB — MAGNESIUM: Magnesium: 2.3 mg/dL (ref 1.7–2.4)

## 2023-11-30 LAB — PHOSPHORUS: Phosphorus: 4 mg/dL (ref 2.5–4.6)

## 2023-11-30 LAB — ANAEROBIC AND AEROBIC CULTURE

## 2023-11-30 MED ORDER — ORAL CARE MOUTH RINSE: 15.0000 mL | OROMUCOSAL | Status: DC | PRN

## 2023-11-30 MED ORDER — ENOXAPARIN SODIUM 40 MG/0.4ML IJ SOSY
40.0000 mg | PREFILLED_SYRINGE | INTRAMUSCULAR | Status: DC
  Filled 2023-11-30 (×2): qty 0.4

## 2023-11-30 MED ORDER — ACETAMINOPHEN 325 MG PO TABS: 650.0000 mg | ORAL_TABLET | Freq: Four times a day (QID) | ORAL | Status: DC

## 2023-11-30 MED ORDER — ACETAMINOPHEN 325 MG PO TABS: 650.0000 mg | ORAL_TABLET | Freq: Four times a day (QID) | ORAL | Status: DC | PRN

## 2023-11-30 MED ORDER — OXYCODONE HCL 5 MG PO TABS
5.0000 mg | ORAL_TABLET | Freq: Once | ORAL | Status: AC
  Administered 2023-11-30: 5 mg via ORAL
  Filled 2023-11-30: qty 1

## 2023-11-30 MED ORDER — KETOROLAC TROMETHAMINE 15 MG/ML IJ SOLN: 30.0000 mg | Freq: Four times a day (QID) | INTRAMUSCULAR | Status: DC

## 2023-11-30 MED ORDER — ACETAMINOPHEN 325 MG PO TABS
650.0000 mg | ORAL_TABLET | Freq: Four times a day (QID) | ORAL | Status: DC
  Administered 2023-11-30 – 2023-12-01 (×4): 650 mg via ORAL
  Filled 2023-11-30 (×4): qty 2

## 2023-11-30 MED ORDER — GABAPENTIN 300 MG PO CAPS
300.0000 mg | ORAL_CAPSULE | Freq: Once | ORAL | Status: AC
  Administered 2023-11-30: 300 mg via ORAL
  Filled 2023-11-30: qty 1

## 2023-11-30 MED ORDER — ONDANSETRON HCL 4 MG PO TABS: 4.0000 mg | ORAL_TABLET | Freq: Four times a day (QID) | ORAL | Status: DC | PRN

## 2023-11-30 MED ORDER — SODIUM CHLORIDE 0.9 % IV SOLN
2.0000 g | Freq: Three times a day (TID) | INTRAVENOUS | Status: DC
  Administered 2023-11-30 – 2023-12-01 (×4): 2 g via INTRAVENOUS
  Filled 2023-11-30 (×4): qty 12.5

## 2023-11-30 MED ORDER — HYDROMORPHONE HCL 1 MG/ML IJ SOLN
1.0000 mg | INTRAMUSCULAR | Status: DC | PRN
  Administered 2023-11-30 – 2023-12-01 (×6): 1 mg via INTRAVENOUS
  Filled 2023-11-30 (×6): qty 1

## 2023-11-30 MED ORDER — ACETAMINOPHEN 650 MG RE SUPP: 650.0000 mg | Freq: Four times a day (QID) | RECTAL | Status: DC | PRN

## 2023-11-30 MED ORDER — GADOBUTROL 1 MMOL/ML IV SOLN
9.4000 mL | Freq: Once | INTRAVENOUS | Status: AC | PRN
  Administered 2023-11-30: 9.4 mL via INTRAVENOUS

## 2023-11-30 MED ORDER — ACETAMINOPHEN 650 MG RE SUPP: 650.0000 mg | Freq: Four times a day (QID) | RECTAL | Status: DC

## 2023-11-30 MED ORDER — VANCOMYCIN HCL 2000 MG/400ML IV SOLN
2000.0000 mg | Freq: Once | INTRAVENOUS | Status: AC
  Administered 2023-11-30: 2000 mg via INTRAVENOUS
  Filled 2023-11-30: qty 400

## 2023-11-30 MED ORDER — HYDROMORPHONE HCL 1 MG/ML IJ SOLN
0.5000 mg | INTRAMUSCULAR | Status: DC | PRN
  Administered 2023-11-30 (×3): 0.5 mg via INTRAVENOUS
  Filled 2023-11-30 (×3): qty 0.5

## 2023-11-30 MED ORDER — ONDANSETRON HCL 4 MG/2ML IJ SOLN
4.0000 mg | Freq: Four times a day (QID) | INTRAMUSCULAR | Status: DC | PRN
Start: 2023-11-30 — End: 2023-12-01

## 2023-11-30 MED ORDER — KETOROLAC TROMETHAMINE 15 MG/ML IJ SOLN
30.0000 mg | Freq: Four times a day (QID) | INTRAMUSCULAR | Status: DC
  Administered 2023-11-30 – 2023-12-01 (×4): 30 mg via INTRAVENOUS
  Filled 2023-11-30 (×4): qty 2

## 2023-11-30 MED ORDER — VANCOMYCIN HCL 1500 MG/300ML IV SOLN
1500.0000 mg | Freq: Two times a day (BID) | INTRAVENOUS | Status: DC
  Administered 2023-11-30 (×2): 1500 mg via INTRAVENOUS
  Filled 2023-11-30 (×2): qty 300

## 2023-11-30 NOTE — ED Notes (Signed)
 Pt L hand elevated and ice packs applied.

## 2023-11-30 NOTE — Progress Notes (Signed)
 Pharmacy Antibiotic Note  Greg Mason is a 53 y.o. male admitted on 11/29/2023 with cellulitis.  Pharmacy has been consulted for cefepime and vancomycin dosing.  Plan: Cefepime 2g q8h  Vancomycin 1500mg  q12h (eAUC 519, Scr 1.01) F/u renal function, infectious work up and narrow as able Vancomycin levels as needed  Height: 6\' 1"  (185.4 cm) Weight: 94.8 kg (209 lb) IBW/kg (Calculated) : 79.9  Temp (24hrs), Avg:98.1 F (36.7 C), Min:98.1 F (36.7 C), Max:98.1 F (36.7 C)  Recent Labs  Lab 11/24/23 1547 11/30/23 0009  WBC 10.4 7.0  CREATININE 1.24 1.01    Estimated Creatinine Clearance: 96.7 mL/min (by C-G formula based on SCr of 1.01 mg/dL).    No Known Allergies  Antimicrobials this admission: Vancomycin 3/7 > Cefepime 3/7 >  Thank you for allowing pharmacy to be a part of this patient's care.  Marja Kays 11/30/2023 5:43 AM

## 2023-11-30 NOTE — H&P (Signed)
 History and Physical    Patient: Greg Mason XBJ:[id number] DOB: July 14, 1971 DOA: 11/29/2023 DOS: the patient was seen and examined on 11/30/2023 PCP: Arrie Senate, FNP  Patient coming from: Home  Chief Complaint:  Chief Complaint  Patient presents with   Hand Injury   HPI: Greg Mason is a 53 y.o. male with no significant medical history who presents emergency department due to left hand pain.  He was initially seen in the ED on 11/24/2023 due to cellulitis of left hand.  Patient had an abscess in the hand and already drained the abscess about 4 days prior to presenting to the ED.  He was treated with IM Rocephin and was prescribed Keflex and doxycycline on discharge.  Unfortunately, there was no improvement in patient's symptoms and has been to his primary care physician on 3/3 and 3/4 where he was given an additional Rocephin without improvement.  He endorsed worsening pain in the left hand with difficulty in being able to flex or extend the hand, but denies chest pain, shortness of breath, fever, chills, nausea or vomiting  ED Course:  In the emergency department, he was hemodynamically stable.  Workup in the ED showed normal CBC and normal BMP except for BUN of 28 and CBG of 106. He was treated with IV cefepime and vancomycin.  Pain medication was given.  Zofran was also given.  Hospitalist was asked to admit patient for further evaluation and management.  Review of Systems: Review of systems as noted in the HPI. All other systems reviewed and are negative.   Past Medical History:  Diagnosis Date   Hyperlipidemia    Past Surgical History:  Procedure Laterality Date   APPENDECTOMY     DG ANKLE LEFT COMPLETE ([facility] HX)     HERNIA REPAIR     KNEE ARTHROSCOPY      Social History:  reports that he has never smoked. His smokeless tobacco use includes snuff. He reports that he does not drink alcohol and does not use drugs.   No Known Allergies  Family History  Problem  Relation Age of Onset   Diabetes Mother    Seizures Sister      Prior to Admission medications   Medication Sig Start Date End Date Taking? Authorizing Provider  cephALEXin (KEFLEX) 500 MG capsule Take 1 capsule (500 mg total) by mouth 3 (three) times daily. 11/24/23   Glyn Ade, MD  diphenhydrAMINE (BENADRYL) 25 mg capsule Take 25 mg by mouth at bedtime as needed for itching.    [provider]  methylPREDNISolone (MEDROL DOSEPAK) 4 MG TBPK tablet Per package instructions 11/23/23   Couture, Cortni S, PA-C  oxyCODONE (ROXICODONE) 5 MG immediate release tablet Take 1 tablet (5 mg total) by mouth every 6 (six) hours as needed for severe pain (pain score 7-10). 11/24/23   Glyn Ade, MD  sildenafil (VIAGRA) 100 MG tablet Take 1 tablet (100 mg total) by mouth daily as needed for erectile dysfunction. 10/16/23   [name], Aleen Campi, FNP  terbinafine (LAMISIL) 250 MG tablet Take 1 tablet (250 mg total) by mouth daily. 10/16/23   Arrie Senate, FNP    Physical Exam: BP 109/63   Pulse 60   Temp 98.1 F (36.7 C) (Oral)   Resp 18   Ht 6\' 1"  (1.854 m)   Wt 94.8 kg   SpO2 94%   BMI 27.57 kg/m   General: 53 y.o. year-old male well developed well nourished in no acute distress.  Alert and  oriented x3. HEENT: NCAT, EOMI Neck: Supple, trachea medial Cardiovascular: Regular rate and rhythm with no rubs or gallops.  No thyromegaly or JVD noted.  No lower extremity edema. 2/4 pulses in all 4 extremities. Respiratory: Clear to auscultation with no wheezes or rales. Good inspiratory effort. Abdomen: Soft, nontender nondistended with normal bowel sounds x4 quadrants. Muskuloskeletal: Erythema, swelling, warmth and tender to touch of left hand. Neuro: CN II-XII intact, strength 5/5 x 4, sensation, reflexes intact Skin: No ulcerative lesions noted or rashes Psychiatry: Judgement and insight appear normal. Mood is appropriate for condition and setting          Labs on  Admission:  Basic Metabolic Panel: Recent Labs  Lab 11/24/23 1547 11/30/23 0009  NA 143 138  K 4.0 4.0  CL 104 102  CO2 26 26  GLUCOSE 125* 106*  BUN 17 28*  CREATININE 1.24 1.01  CALCIUM 9.6 9.3   Liver Function Tests: Recent Labs  Lab 11/24/23 1547 11/30/23 0009  AST 28 34  ALT 27 34  ALKPHOS 81 91  BILITOT 0.5 0.7  PROT 6.7 7.1  ALBUMIN 3.7 4.0   No results for input(s): "LIPASE", "AMYLASE" in the last 168 hours. No results for input(s): "AMMONIA" in the last 168 hours. CBC: Recent Labs  Lab 11/24/23 1547 11/30/23 0009  WBC 10.4 7.0  NEUTROABS  --  4.0  HGB 15.1 15.2  HCT 44.3 43.9  MCV 90.8 87.3  PLT 270 266   Cardiac Enzymes: No results for input(s): "CKTOTAL", "CKMB", "CKMBINDEX", "TROPONINI" in the last 168 hours.  BNP (last 3 results) No results for input(s): "BNP" in the last 8760 hours.  ProBNP (last 3 results) No results for input(s): "PROBNP" in the last 8760 hours.  CBG: No results for input(s): "GLUCAP" in the last 168 hours.  Radiological Exams on Admission: No results found.  EKG: I independently viewed the EKG done and my findings are as followed: EKG was not done in the ED  Assessment/Plan Present on Admission:  Cellulitis of left hand  Principal Problem:   Cellulitis of left hand  Cellulitis of left hand Patient was started on IV vancomycin and cefepime, we shall continue with same at this time Continue IV Dilaudid 0.5 mg every 3 hours as needed   DVT prophylaxis: Lovenox   Code Status: Full code  Family Communication: None at bedside  Consults: None  Severity of Illness: The appropriate patient status for this patient is INPATIENT. Inpatient status is judged to be reasonable and necessary in order to provide the required intensity of service to ensure the patient's safety. The patient's presenting symptoms, physical exam findings, and initial radiographic and laboratory data in the context of their chronic  comorbidities is felt to place them at high risk for further clinical deterioration. Furthermore, it is not anticipated that the patient will be medically stable for discharge from the hospital within 2 midnights of admission.   * I certify that at the point of admission it is my clinical judgment that the patient will require inpatient hospital care spanning beyond 2 midnights from the point of admission due to high intensity of service, high risk for further deterioration and high frequency of surveillance required.*  Author: Frankey Shown, DO 11/30/2023 4:52 AM  For on call review [email or web address].

## 2023-11-30 NOTE — Progress Notes (Signed)
   11/30/23 1528  TOC Brief Assessment  Insurance and Status Reviewed  Patient has primary care physician Yes  Home environment has been reviewed Home  Prior level of function: independent  Prior/Current Home Services No current home services  Social Drivers of Health Review SDOH reviewed no interventions necessary  Readmission risk has been reviewed Yes  Transition of care needs no transition of care needs at this time   Transition of Care Department [facility]) has reviewed patient and no TOC needs have been identified at this time. We will continue to monitor patient advancement through interdisciplinary progression rounds. If new patient transition needs arise, please place a TOC consult.

## 2023-11-30 NOTE — Progress Notes (Signed)
 PROGRESS NOTE    Greg Mason  ZOX:[id number] DOB: [birth year] DOA: 11/29/2023 PCP: Arrie Senate, FNP   Brief Narrative:    Greg Mason is a 53 y.o. male with no significant medical history who presents emergency department due to left hand pain.  He was initially seen in the ED on 11/24/2023 due to cellulitis of left hand.  Patient had an abscess in the hand and already drained the abscess about 4 days prior to presenting to the ED.  He was treated with IM Rocephin and was prescribed Keflex and doxycycline on discharge.  Unfortunately, there was no improvement in patient's symptoms and has been to his primary care physician on 3/3 and 3/4 where he was given an additional Rocephin without improvement.   Assessment & Plan:   Principal Problem:   Cellulitis of left hand  Assessment and Plan:   Cellulitis of left hand Patient was started on IV vancomycin and cefepime, we shall continue with same at this time Continue IV Dilaudid 0.5 mg every 3 hours as needed Elevate left hand and apply ice to help reduce swelling X-ray with no significant findings Consider consultation with orthopedics if not improving in the next 1-2 days Check MRSA PCR Wound cultures from 3/3 positive for MRSA.  Will likely require linezolid on discharge based on sensitivities   DVT prophylaxis:Lovenox Code Status: Full Family Communication: None at bedside Disposition Plan: Admit for treatment with IV antibiotics Status is: Inpatient Remains inpatient appropriate because: Need for IV medications.   Consultants:  None  Procedures:  None  Antimicrobials:  Anti-infectives (From admission, onward)    Start     Dose/Rate Route Frequency Ordered Stop   11/30/23 1200  vancomycin (VANCOREADY) IVPB 1500 mg/300 mL        1,500 mg 150 mL/hr over 120 Minutes Intravenous Every 12 hours 11/30/23 0517     11/30/23 0800  ceFEPIme (MAXIPIME) 2 g in sodium chloride 0.9 % 100 mL IVPB        2 g 200 mL/hr  over 30 Minutes Intravenous Every 8 hours 11/30/23 0517     11/30/23 0015  vancomycin (VANCOREADY) IVPB 2000 mg/400 mL        2,000 mg 200 mL/hr over 120 Minutes Intravenous  Once 11/30/23 0007 11/30/23 0252   11/30/23 0000  ceFEPIme (MAXIPIME) 2 g in sodium chloride 0.9 % 100 mL IVPB        2 g 200 mL/hr over 30 Minutes Intravenous  Once 11/29/23 2358 11/30/23 0049      Subjective: Patient seen and evaluated today with no new acute complaints or concerns. No acute concerns or events noted overnight.  He continues to have some mild left hand pain along with swelling and finds it difficult to flex or extend his fingers due to the swelling.  Objective: Vitals:   11/30/23 0200 11/30/23 0300 11/30/23 0400 11/30/23 0500  BP: 118/68 123/83 109/63 127/65  Pulse:    66  Resp:    16  Temp:      TempSrc:      SpO2:    98%  Weight:      Height:        Intake/Output Summary (Last 24 hours) at 11/30/2023 1026 Last data filed at 11/30/2023 0252 Gross per 24 hour  Intake 493.16 ml  Output --  Net 493.16 ml   Filed Weights   11/29/23 1920  Weight: 94.8 kg    Examination:  General exam: Appears calm and comfortable  Respiratory  system: Clear to auscultation. Respiratory effort normal. Cardiovascular system: S1 & S2 heard, RRR.  Gastrointestinal system: Abdomen is soft Central nervous system: Alert and awake Extremities: Left hand edema with minimal erythema that has improved Skin: No significant lesions noted Psychiatry: Flat affect.    Data Reviewed: I have personally reviewed following labs and imaging studies  CBC: Recent Labs  Lab 11/24/23 1547 11/30/23 0009  WBC 10.4 7.0  NEUTROABS  --  4.0  HGB 15.1 15.2  HCT 44.3 43.9  MCV 90.8 87.3  PLT 270 266   Basic Metabolic Panel: Recent Labs  Lab 11/24/23 1547 11/30/23 0009 11/30/23 0330  NA 143 138  --   K 4.0 4.0  --   CL 104 102  --   CO2 26 26  --   GLUCOSE 125* 106*  --   BUN 17 28*  --   CREATININE 1.24 1.01   --   CALCIUM 9.6 9.3  --   MG  --   --  2.3  PHOS  --   --  4.0   GFR: Estimated Creatinine Clearance: 96.7 mL/min (by C-G formula based on SCr of 1.01 mg/dL). Liver Function Tests: Recent Labs  Lab 11/24/23 1547 11/30/23 0009  AST 28 34  ALT 27 34  ALKPHOS 81 91  BILITOT 0.5 0.7  PROT 6.7 7.1  ALBUMIN 3.7 4.0   No results for input(s): "LIPASE", "AMYLASE" in the last 168 hours. No results for input(s): "AMMONIA" in the last 168 hours. Coagulation Profile: No results for input(s): "INR", "PROTIME" in the last 168 hours. Cardiac Enzymes: No results for input(s): "CKTOTAL", "CKMB", "CKMBINDEX", "TROPONINI" in the last 168 hours. BNP (last 3 results) No results for input(s): "PROBNP" in the last 8760 hours. HbA1C: No results for input(s): "HGBA1C" in the last 72 hours. CBG: No results for input(s): "GLUCAP" in the last 168 hours. Lipid Profile: No results for input(s): "CHOL", "HDL", "LDLCALC", "TRIG", "CHOLHDL", "LDLDIRECT" in the last 72 hours. Thyroid Function Tests: No results for input(s): "TSH", "T4TOTAL", "FREET4", "T3FREE", "THYROIDAB" in the last 72 hours. Anemia Panel: No results for input(s): "VITAMINB12", "FOLATE", "FERRITIN", "TIBC", "IRON", "RETICCTPCT" in the last 72 hours. Sepsis Labs: No results for input(s): "PROCALCITON", "LATICACIDVEN" in the last 168 hours.  Recent Results (from the past 240 hours)  Anaerobic and Aerobic Culture     Status: Abnormal (Preliminary result)   Collection Time: 11/26/23 12:31 PM   Specimen: Wound   HD  Result Value Ref Range Status   Anaerobic Culture WILL FOLLOW  Preliminary   Aerobic Culture Final report (A)  Final   Result 1 Comment (A)  Final    Comment: Methicillin - resistant Staphylococcus aureus Based on resistance to oxacillin this isolate would be resistant to all currently available beta-lactam antimicrobial agents, with the exception of the newer cephalosporins with anti-MRSA activity, such  as Ceftaroline Scant growth    Antimicrobial Susceptibility Comment  Final    Comment:       ** S = Susceptible; I = Intermediate; R = Resistant **                    P = Positive; N = Negative             MICS are expressed in micrograms per mL    Antibiotic                 RSLT#1    RSLT#2    RSLT#3  RSLT#4 Ciprofloxacin                  R Clindamycin                    S Erythromycin                   R Gentamicin                     S Levofloxacin                   I Linezolid                      S Oxacillin                      R Penicillin                     R Rifampin                       S Tetracycline                   S Trimethoprim/Sulfa             R Vancomycin                     S          Radiology Studies: No results found.      Scheduled Meds:  enoxaparin (LOVENOX) injection  40 mg Subcutaneous Q24H   Continuous Infusions:  ceFEPime (MAXIPIME) IV 2 g (11/30/23 0733)   vancomycin       LOS: 0 days    Time spent: 55 minutes    Donelle Hise D Sherryll Burger, DO [facility]  If 7PM-7AM, please contact night-coverage [email or web address] 11/30/2023, 10:26 AM

## 2023-11-30 NOTE — ED Notes (Signed)
 Pt states the 0.5mg  dilaudid is not helping with his pain. MD notified.

## 2023-11-30 NOTE — Progress Notes (Signed)
 ED Pharmacy Antibiotic Sign Off An antibiotic consult was received from an ED provider for vancomycin per pharmacy dosing for cellulitis. Prior presentation to ED on 3/1, prescribed keflex and doxycycline x 5 days.   The following one time order(s) were placed:  Vancomycin 2g  Further antibiotic and/or antibiotic pharmacy consults should be ordered by the admitting provider if indicated.   Thank you for allowing pharmacy to be a part of this patient's care.   Marja Kays, [facility]  Clinical Pharmacist 11/30/23 12:04 AM

## 2023-11-30 NOTE — ED Provider Notes (Signed)
 [place] EMERGENCY DEPARTMENT AT [facility] Provider Note  CSN: [id number] Arrival date & time: 11/29/23 1849  Chief Complaint(s) Hand Injury  HPI Greg Mason is a 53 y.o. male with PMH HLD who presents emergency room for evaluation of left hand pain.  Patient initially seen on 11/24/2023 in the emergency department and diagnosed with cellulitis of the hand.  Patient had reportedly drained his own obsess at home and the cellulitis was 44 days old at that time.  Was given IM Rocephin at that time and discharged on Doxy and Keflex.  Symptoms have been worsening and has been seen by his primary care physician twice on 11/26/2023 and 11/27/2023.  Has received additional doses of Rocephin without improvement.  Denies systemic symptoms including fever, chills, nausea, vomiting but endorses persistent pain in the hand.   Past Medical History Past Medical History:  Diagnosis Date   Hyperlipidemia    Patient Active Problem List   Diagnosis Date Noted   Abscess 03/14/2023   Cellulitis of right arm 03/08/2023   Non-cardiac chest pain 02/21/2023   Home Medication(s) Prior to Admission medications   Medication Sig Start Date End Date Taking? Authorizing Provider  cephALEXin (KEFLEX) 500 MG capsule Take 1 capsule (500 mg total) by mouth 3 (three) times daily. 11/24/23   Glyn Ade, MD  diphenhydrAMINE (BENADRYL) 25 mg capsule Take 25 mg by mouth at bedtime as needed for itching.    [provider]  methylPREDNISolone (MEDROL DOSEPAK) 4 MG TBPK tablet Per package instructions 11/23/23   Couture, Cortni S, PA-C  oxyCODONE (ROXICODONE) 5 MG immediate release tablet Take 1 tablet (5 mg total) by mouth every 6 (six) hours as needed for severe pain (pain score 7-10). 11/24/23   Glyn Ade, MD  sildenafil (VIAGRA) 100 MG tablet Take 1 tablet (100 mg total) by mouth daily as needed for erectile dysfunction. 10/16/23   [name], Aleen Campi, FNP  terbinafine (LAMISIL) 250 MG tablet  Take 1 tablet (250 mg total) by mouth daily. 10/16/23   [name], Aleen Campi, FNP                                                                                                                                    Past Surgical History Past Surgical History:  Procedure Laterality Date   APPENDECTOMY     DG ANKLE LEFT COMPLETE ([facility] HX)     HERNIA REPAIR     KNEE ARTHROSCOPY     Family History Family History  Problem Relation Age of Onset   Diabetes Mother    Seizures Sister     Social History Social History   Tobacco Use   Smoking status: Never   Smokeless tobacco: Current    Types: Snuff  Vaping Use   Vaping status: Never Used  Substance Use Topics   Alcohol use: Never   Drug use: Never   Allergies Patient has no known allergies.  Review of Systems Review of Systems  Musculoskeletal:  Positive for arthralgias and joint swelling.  Skin:  Positive for rash.    Physical Exam Vital Signs  I have reviewed the triage vital signs BP (!) 147/91 (BP Location: Right Arm)   Pulse 74   Temp 98.1 F (36.7 C) (Oral)   Resp 16   Ht 6\' 1"  (1.854 m)   Wt 94.8 kg   SpO2 100%   BMI 27.57 kg/m   Physical Exam Constitutional:      General: He is not in acute distress.    Appearance: Normal appearance.  HENT:     Head: Normocephalic and atraumatic.     Nose: No congestion or rhinorrhea.  Eyes:     General:        Right eye: No discharge.        Left eye: No discharge.     Extraocular Movements: Extraocular movements intact.     Pupils: Pupils are equal, round, and reactive to light.  Cardiovascular:     Rate and Rhythm: Normal rate and regular rhythm.     Heart sounds: No murmur heard. Pulmonary:     Effort: No respiratory distress.     Breath sounds: No wheezing or rales.  Abdominal:     General: There is no distension.     Tenderness: There is no abdominal tenderness.  Musculoskeletal:        General: Swelling and tenderness present. Normal range of motion.      Cervical back: Normal range of motion.  Skin:    General: Skin is warm and dry.     Findings: Erythema present.  Neurological:     General: No focal deficit present.     Mental Status: He is alert.     ED Results and Treatments Labs (all labs ordered are listed, but only abnormal results are displayed) Labs Reviewed  COMPREHENSIVE METABOLIC PANEL  CBC WITH DIFFERENTIAL/PLATELET                                                                                                                          Radiology No results found.  Pertinent labs & imaging results that were available during my care of the patient were reviewed by me and considered in my medical decision making (see MDM for details).  Medications Ordered in ED Medications  ceFEPIme (MAXIPIME) 2 g in sodium chloride 0.9 % 100 mL IVPB (2 g Intravenous New Bag/Given 11/30/23 0019)  vancomycin (VANCOREADY) IVPB 2000 mg/400 mL (has no administration in time range)  morphine (PF) 4 MG/ML injection 4 mg (4 mg Intravenous Given 11/30/23 0011)  ketorolac (TORADOL) 15 MG/ML injection 15 mg (15 mg Intramuscular Given 11/30/23 0020)  ondansetron (ZOFRAN) injection 4 mg (4 mg Intravenous Given 11/30/23 0013)  Procedures Procedures  (including critical care time)  Medical Decision Making / ED Course   This patient presents to the ED for concern of hand pain, this involves an extensive number of treatment options, and is a complaint that carries with it a high risk of complications and morbidity.  The differential diagnosis includes cellulitis, abscess, tenosynovitis, necrotizing soft tissue infection  MDM: Patient seen emergency room for evaluation of hand pain and swelling.  Physical exam with persistent erythema and swelling from a wound on the ring finger that tracks over the dorsum of the hand up  and to the wrist.  Neurologic exam is unremarkable pulses are intact.  Laboratory evaluation unremarkable.  Patient has already failed multiple doses of Rocephin and oral antibiotics and thus broad-spectrum antibiotics with Vanco cefepime initiated.  Will require hospital admission for failure of outpatient antibiotics.   Additional history obtained:  -External records from outside source obtained and reviewed including: Chart review including previous notes, labs, imaging, consultation notes   Lab Tests: -I ordered, reviewed, and interpreted labs.   The pertinent results include:   Labs Reviewed  COMPREHENSIVE METABOLIC PANEL  CBC WITH DIFFERENTIAL/PLATELET      Medicines ordered and prescription drug management: Meds ordered this encounter  Medications   ceFEPIme (MAXIPIME) 2 g in sodium chloride 0.9 % 100 mL IVPB   morphine (PF) 4 MG/ML injection 4 mg    Refill:  0   ketorolac (TORADOL) 15 MG/ML injection 15 mg   ondansetron (ZOFRAN) injection 4 mg   vancomycin (VANCOREADY) IVPB 2000 mg/400 mL    Indication::   Cellulitis    -I have reviewed the patients home medicines and have made adjustments as needed  Critical interventions none   Cardiac Monitoring: The patient was maintained on a cardiac monitor.  I personally viewed and interpreted the cardiac monitored which showed an underlying rhythm of: NSR  Social Determinants of Health:  Factors impacting patients care include: none   Reevaluation: After the interventions noted above, I reevaluated the patient and found that they have :improved  Co morbidities that complicate the patient evaluation  Past Medical History:  Diagnosis Date   Hyperlipidemia       Dispostion: I considered admission for this patient, and patient require hospital admission for cellulitis, failure of outpatient oral antibiotics and IM antibiotics     Final Clinical Impression(s) / ED Diagnoses Final diagnoses:  None      @PCDICTATION @    Glendora Score, MD 11/30/23 [phone]

## 2023-12-01 ENCOUNTER — Encounter (HOSPITAL_COMMUNITY): Payer: Self-pay | Admitting: Internal Medicine

## 2023-12-01 DIAGNOSIS — L03114 Cellulitis of left upper limb: Secondary | ICD-10-CM | POA: Diagnosis not present

## 2023-12-01 LAB — CBC
HCT: 43.9 % (ref 39.0–52.0)
Hemoglobin: 14.3 g/dL (ref 13.0–17.0)
MCH: 29.4 pg (ref 26.0–34.0)
MCHC: 32.6 g/dL (ref 30.0–36.0)
MCV: 90.1 fL (ref 80.0–100.0)
Platelets: 223 10*3/uL (ref 150–400)
RBC: 4.87 MIL/uL (ref 4.22–5.81)
RDW: 12 % (ref 11.5–15.5)
WBC: 5.8 10*3/uL (ref 4.0–10.5)
nRBC: 0 % (ref 0.0–0.2)

## 2023-12-01 LAB — BASIC METABOLIC PANEL
Anion gap: 7 (ref 5–15)
BUN: 32 mg/dL — ABNORMAL HIGH (ref 6–20)
CO2: 26 mmol/L (ref 22–32)
Calcium: 8.8 mg/dL — ABNORMAL LOW (ref 8.9–10.3)
Chloride: 104 mmol/L (ref 98–111)
Creatinine, Ser: 1.08 mg/dL (ref 0.61–1.24)
GFR, Estimated: >60 mL/min (ref >60)
Glucose, Bld: 141 mg/dL — ABNORMAL HIGH (ref 70–99)
Potassium: 4.7 mmol/L (ref 3.5–5.1)
Sodium: 137 mmol/L (ref 135–145)

## 2023-12-01 LAB — MAGNESIUM: Magnesium: 2.1 mg/dL (ref 1.7–2.4)

## 2023-12-01 MED ORDER — LINEZOLID 600 MG PO TABS: 600.0000 mg | ORAL_TABLET | Freq: Two times a day (BID) | ORAL | 0 refills | Status: AC

## 2023-12-01 MED ORDER — IBUPROFEN 800 MG PO TABS: 800.0000 mg | ORAL_TABLET | Freq: Two times a day (BID) | ORAL | 0 refills | Status: DC

## 2023-12-01 MED ORDER — ACETAMINOPHEN 325 MG PO TABS: 650.0000 mg | ORAL_TABLET | Freq: Two times a day (BID) | ORAL | 0 refills | Status: AC

## 2023-12-01 MED ORDER — OXYCODONE HCL 5 MG PO TABS: 5.0000 mg | ORAL_TABLET | Freq: Four times a day (QID) | ORAL | 0 refills | Status: DC | PRN

## 2023-12-01 NOTE — Plan of Care (Signed)

## 2023-12-01 NOTE — Progress Notes (Signed)
 Patient removed own IV. Discharge instructions explained. Patient discharged via wheelchair to ED parking lot to car.

## 2023-12-01 NOTE — Plan of Care (Signed)
 Problem: Education: Goal: Knowledge of General Education information will improve Description: Including pain rating scale, medication(s)/side effects and non-pharmacologic comfort measures 12/01/2023 0701 by Charna Elizabeth, RN Outcome: Progressing 12/01/2023 0700 by Charna Elizabeth, RN Outcome: Progressing 12/01/2023 0700 by Charna Elizabeth, RN Outcome: Progressing   Problem: Health Behavior/Discharge Planning: Goal: Ability to manage health-related needs will improve 12/01/2023 0701 by Charna Elizabeth, RN Outcome: Progressing 12/01/2023 0700 by Charna Elizabeth, RN Outcome: Progressing 12/01/2023 0700 by Charna Elizabeth, RN Outcome: Progressing   Problem: Clinical Measurements: Goal: Ability to maintain clinical measurements within normal limits will improve 12/01/2023 0701 by Charna Elizabeth, RN Outcome: Progressing 12/01/2023 0700 by Charna Elizabeth, RN Outcome: Progressing 12/01/2023 0700 by Charna Elizabeth, RN Outcome: Progressing Goal: Will remain free from infection 12/01/2023 0701 by Charna Elizabeth, RN Outcome: Progressing 12/01/2023 0700 by Charna Elizabeth, RN Outcome: Progressing 12/01/2023 0700 by Charna Elizabeth, RN Outcome: Progressing Goal: Diagnostic test results will improve 12/01/2023 0701 by Charna Elizabeth, RN Outcome: Progressing 12/01/2023 0700 by Charna Elizabeth, RN Outcome: Progressing 12/01/2023 0700 by Charna Elizabeth, RN Outcome: Progressing Goal: Respiratory complications will improve 12/01/2023 0701 by Charna Elizabeth, RN Outcome: Progressing 12/01/2023 0700 by Charna Elizabeth, RN Outcome: Progressing 12/01/2023 0700 by Charna Elizabeth, RN Outcome: Progressing Goal: Cardiovascular complication will be avoided 12/01/2023 0701 by Charna Elizabeth, RN Outcome: Progressing 12/01/2023 0700 by Charna Elizabeth, RN Outcome: Progressing 12/01/2023 0700 by Charna Elizabeth, RN Outcome: Progressing   Problem: Activity: Goal: Risk for activity intolerance will decrease 12/01/2023 0701 by Charna Elizabeth, RN Outcome: Progressing 12/01/2023 0700 by Charna Elizabeth, RN Outcome: Progressing 12/01/2023  0700 by Charna Elizabeth, RN Outcome: Progressing   Problem: Nutrition: Goal: Adequate nutrition will be maintained 12/01/2023 0701 by Charna Elizabeth, RN Outcome: Progressing 12/01/2023 0700 by Charna Elizabeth, RN Outcome: Progressing 12/01/2023 0700 by Charna Elizabeth, RN Outcome: Progressing   Problem: Coping: Goal: Level of anxiety will decrease 12/01/2023 0701 by Charna Elizabeth, RN Outcome: Progressing 12/01/2023 0700 by Charna Elizabeth, RN Outcome: Progressing 12/01/2023 0700 by Charna Elizabeth, RN Outcome: Progressing   Problem: Elimination: Goal: Will not experience complications related to bowel motility 12/01/2023 0701 by Charna Elizabeth, RN Outcome: Progressing 12/01/2023 0700 by Charna Elizabeth, RN Outcome: Progressing 12/01/2023 0700 by Charna Elizabeth, RN Outcome: Progressing Goal: Will not experience complications related to urinary retention 12/01/2023 0701 by Charna Elizabeth, RN Outcome: Progressing 12/01/2023 0700 by Charna Elizabeth, RN Outcome: Progressing 12/01/2023 0700 by Charna Elizabeth, RN Outcome: Progressing   Problem: Pain Managment: Goal: General experience of comfort will improve and/or be controlled 12/01/2023 0701 by Charna Elizabeth, RN Outcome: Progressing 12/01/2023 0700 by Charna Elizabeth, RN Outcome: Progressing 12/01/2023 0700 by Charna Elizabeth, RN Outcome: Progressing   Problem: Safety: Goal: Ability to remain free from injury will improve 12/01/2023 0701 by Charna Elizabeth, RN Outcome: Progressing 12/01/2023 0700 by Charna Elizabeth, RN Outcome: Progressing 12/01/2023 0700 by Charna Elizabeth, RN Outcome: Progressing   Problem: Skin Integrity: Goal: Risk for impaired skin integrity will decrease 12/01/2023 0701 by Charna Elizabeth, RN Outcome: Progressing 12/01/2023 0700 by Charna Elizabeth, RN Outcome: Progressing 12/01/2023 0700 by Charna Elizabeth, RN Outcome: Progressing   Problem: Clinical Measurements: Goal: Ability to avoid or minimize complications of infection will improve 12/01/2023 0701 by Charna Elizabeth, RN Outcome: Progressing 12/01/2023 0700 by Charna Elizabeth,  RN Outcome: Progressing 12/01/2023 0700 by Charna Elizabeth, RN Outcome: Progressing   Problem: Skin Integrity: Goal: Skin integrity will improve 12/01/2023 0701 by Charna Elizabeth, RN Outcome: Progressing 12/01/2023 0700 by Charna Elizabeth, RN Outcome: Progressing 12/01/2023 0700 by Ledell Peoples,  Mel Almond, RN Outcome: Progressing

## 2023-12-01 NOTE — Discharge Summary (Signed)
 Physician Discharge Summary  Greg Mason BMW:[id number] DOB: 08/04/1971 DOA: 11/29/2023  PCP: Arrie Senate, FNP  Admit date: 11/29/2023  Discharge date: 12/01/2023  Admitted From: Home  Disposition: Home  Recommendations for Outpatient Follow-up:  Follow up with PCP in 1-2 weeks Continue on Zyvox twice daily as prescribed for cellulitis related to MRSA to left hand for total 10-day course Continue alternating doses of Tylenol and ibuprofen as prescribed to assist with inflammation Patient educated on arm elevation as well as ice application Oxycodone prescribed for limited doses to assist with pain management Continue other home medications as prior  Home Health: None  Equipment/Devices: None  Discharge Condition:Stable  CODE STATUS: Full  Diet recommendation: Regular diet  Brief/Interim Summary: Greg Mason is a 53 y.o. male with no significant medical history who presents emergency department due to left hand pain.  He was initially seen in the ED on 11/24/2023 due to cellulitis of left hand.  Patient had an abscess in the hand and already drained the abscess about 4 days prior to presenting to the ED.  He was treated with IM Rocephin and was prescribed Keflex and doxycycline on discharge.  Unfortunately, there was no improvement in patient's symptoms and has been to his primary care physician on 3/3 and 3/4 where he was given an additional Rocephin without improvement.  He was empirically started on IV vancomycin and cefepime while admitted and had imaging studies of his left hand to include MRI with no significant findings noted.  He is now able to move his fingers more than he was prior to admission, but still continues to have a significant amount of pain.  He is agreeable to discharge with oral medications as noted above and will follow up with PCP outpatient.  No other acute events or concerns noted.  Discharge Diagnoses:  Principal Problem:   Cellulitis of left  hand  Principal discharge diagnosis: Left hand cellulitis with wound demonstrating MRSA.  Discharge Instructions  Discharge Instructions     Diet - low sodium heart healthy   Complete by: As directed    Increase activity slowly   Complete by: As directed       Allergies as of 12/01/2023   No Known Allergies      Medication List     STOP taking these medications    cephALEXin 500 MG capsule Commonly known as: KEFLEX   doxycycline 100 MG capsule Commonly known as: VIBRAMYCIN   methylPREDNISolone 4 MG Tbpk tablet Commonly known as: MEDROL DOSEPAK       TAKE these medications    acetaminophen 325 MG tablet Commonly known as: TYLENOL Take 2 tablets (650 mg total) by mouth every 12 (twelve) hours for 10 days.   diphenhydrAMINE 25 mg capsule Commonly known as: BENADRYL Take 25-50 mg by mouth at bedtime as needed for itching.   ibuprofen 800 MG tablet Commonly known as: ADVIL Take 1 tablet (800 mg total) by mouth every 12 (twelve) hours.   linezolid 600 MG tablet Commonly known as: ZYVOX Take 1 tablet (600 mg total) by mouth 2 (two) times daily for 8 days.   oxyCODONE 5 MG immediate release tablet Commonly known as: Roxicodone Take 1-2 tablets (5-10 mg total) by mouth every 6 (six) hours as needed for severe pain (pain score 7-10) or breakthrough pain. What changed:  how much to take reasons to take this   sildenafil 100 MG tablet Commonly known as: Viagra Take 1 tablet (100 mg total) by mouth daily as  needed for erectile dysfunction.   terbinafine 250 MG tablet Commonly known as: LAMISIL Take 1 tablet (250 mg total) by mouth daily.        Follow-up Information     [name], Aleen Campi, FNP. Schedule an appointment as soon as possible for a visit in 1 week(s).   Specialty: Family Medicine Contact information: [address] [place] [address] [phone]                No Known  Allergies  Consultations: None   Procedures/Studies: MR HAND LEFT W WO CONTRAST Result Date: 12/01/2023 CLINICAL DATA:  Left hand pain and swelling since 11/24/2023 EXAM: MRI OF THE LEFT HAND WITHOUT AND WITH CONTRAST TECHNIQUE: Multiplanar, multisequence MR imaging of the and left hand was performed before and after the administration of intravenous contrast. CONTRAST:  9.30mL GADAVIST GADOBUTROL 1 MMOL/ML IV SOLN COMPARISON:  None Available. FINDINGS: Bones/Joint/Cartilage No acute fracture or dislocation. Old healed fracture deformity of the fifth metacarpal. Normal alignment. No joint effusion. No erosive changes. No periostitis. Severe osteoarthritis of the first [facility] joint. Mild osteoarthritis of the scaphotrapeziotrapezoid joint. Mild osteoarthritis of the radiolunate joint with subchondral marrow edema in the distal radius. Them mild osteoarthritis of the first MCP joint. Mild osteoarthritis of the second MCP joint. Ligaments Collateral ligaments are intact. Muscles and Tendons Flexor and extensor compartment tendons are intact. Muscles are normal. Soft tissue No fluid collection or hematoma. No soft tissue mass. Soft tissue edema along the dorsal aspect of the hand concerning for cellulitis. No drainable fluid collection to suggest an abscess. IMPRESSION: 1. Soft tissue edema along the dorsal aspect of the hand concerning for cellulitis. No drainable fluid collection to suggest an abscess. 2. No acute osseous injury of the left hand. 3. Severe osteoarthritis of the first [facility] joint. Electronically Signed   By: Elige Ko M.D.   On: 12/01/2023 08:35   DG Hand Complete Left Result Date: 11/24/2023 CLINICAL DATA:  History of cellulitis.  Hand swelling. EXAM: LEFT HAND - COMPLETE 3+ VIEW COMPARISON:  None Available. FINDINGS: Remote fracture of the fifth metacarpal shaft. Age advanced degenerative changes about the base of the thumb and the ulnar aspect of the carpal bones. No acute fracture or  dislocation. No radiopaque foreign object. No soft tissue gas or osseous destruction. IMPRESSION: No acute osseous abnormality. Electronically Signed   By: Jeronimo Greaves M.D.   On: 11/24/2023 16:59     Discharge Exam: Vitals:   11/30/23 2112 12/01/23 0645  BP: (!) 121/56 110/79  Pulse: 68 (!) 52  Resp: 18 18  Temp: 97.7 F (36.5 C) (!) 97.5 F (36.4 C)  SpO2: 98% 98%   Vitals:   11/30/23 1204 11/30/23 1220 11/30/23 2112 12/01/23 0645  BP:  130/82 (!) 121/56 110/79  Pulse:  (!) 58 68 (!) 52  Resp:  16 18 18   Temp: 98.4 F (36.9 C) 97.7 F (36.5 C) 97.7 F (36.5 C) (!) 97.5 F (36.4 C)  TempSrc:  Oral Oral Oral  SpO2:  100% 98% 98%  Weight:      Height:        General: Pt is alert, awake, not in acute distress Cardiovascular: RRR, S1/S2 +, no rubs, no gallops Respiratory: CTA bilaterally, no wheezing, no rhonchi Abdominal: Soft, NT, ND, bowel sounds + Extremities: no edema, no cyanosis    The results of significant diagnostics from this hospitalization (including imaging, microbiology, ancillary and laboratory) are listed below for reference.  Microbiology: Recent Results (from the past 240 hours)  Anaerobic and Aerobic Culture     Status: Abnormal   Collection Time: 11/26/23 12:31 PM   Specimen: Wound   HD  Result Value Ref Range Status   Anaerobic Culture Final report  Final   Result 1 Comment  Final    Comment: No anaerobic growth in 72 hours.   Aerobic Culture Final report (A)  Final   Result 1 Comment (A)  Final    Comment: Methicillin - resistant Staphylococcus aureus Based on resistance to oxacillin this isolate would be resistant to all currently available beta-lactam antimicrobial agents, with the exception of the newer cephalosporins with anti-MRSA activity, such as Ceftaroline Scant growth    Antimicrobial Susceptibility Comment  Final    Comment:       ** S = Susceptible; I = Intermediate; R = Resistant **                    P = Positive; N =  Negative             MICS are expressed in micrograms per mL    Antibiotic                 RSLT#1    RSLT#2    RSLT#3    RSLT#4 Ciprofloxacin                  R Clindamycin                    S Erythromycin                   R Gentamicin                     S Levofloxacin                   I Linezolid                      S Oxacillin                      R Penicillin                     R Rifampin                       S Tetracycline                   S Trimethoprim/Sulfa             R Vancomycin                     S   MRSA Next Gen by PCR, Nasal     Status: None   Collection Time: 11/30/23 12:00 PM   Specimen: Nasal Mucosa; Nasal Swab  Result Value Ref Range Status   MRSA by PCR Next Gen NOT DETECTED NOT DETECTED Final    Comment: (NOTE) The GeneXpert MRSA Assay (FDA approved for NASAL specimens only), is one component of a comprehensive MRSA colonization surveillance program. It is not intended to diagnose MRSA infection nor to guide or monitor treatment for MRSA infections. Test performance is not FDA approved in patients less than 49 years old. Performed at [facility], [address], [place], [place] [address]      Labs: BNP (last 3  results) No results for input(s): "BNP" in the last 8760 hours. Basic Metabolic Panel: Recent Labs  Lab 11/24/23 1547 11/30/23 0009 11/30/23 0330 12/01/23 0639  NA 143 138  --  137  K 4.0 4.0  --  4.7  CL 104 102  --  104  CO2 26 26  --  26  GLUCOSE 125* 106*  --  141*  BUN 17 28*  --  32*  CREATININE 1.24 1.01  --  1.08  CALCIUM 9.6 9.3  --  8.8*  MG  --   --  2.3 2.1  PHOS  --   --  4.0  --    Liver Function Tests: Recent Labs  Lab 11/24/23 1547 11/30/23 0009  AST 28 34  ALT 27 34  ALKPHOS 81 91  BILITOT 0.5 0.7  PROT 6.7 7.1  ALBUMIN 3.7 4.0   No results for input(s): "LIPASE", "AMYLASE" in the last 168 hours. No results for input(s): "AMMONIA" in the last 168 hours. CBC: Recent Labs  Lab 11/24/23 1547  11/30/23 0009 12/01/23 0639  WBC 10.4 7.0 5.8  NEUTROABS  --  4.0  --   HGB 15.1 15.2 14.3  HCT 44.3 43.9 43.9  MCV 90.8 87.3 90.1  PLT 270 266 223   Cardiac Enzymes: No results for input(s): "CKTOTAL", "CKMB", "CKMBINDEX", "TROPONINI" in the last 168 hours. BNP: Invalid input(s): "POCBNP" CBG: No results for input(s): "GLUCAP" in the last 168 hours. D-Dimer No results for input(s): "DDIMER" in the last 72 hours. Hgb A1c No results for input(s): "HGBA1C" in the last 72 hours. Lipid Profile No results for input(s): "CHOL", "HDL", "LDLCALC", "TRIG", "CHOLHDL", "LDLDIRECT" in the last 72 hours. Thyroid function studies No results for input(s): "TSH", "T4TOTAL", "T3FREE", "THYROIDAB" in the last 72 hours.  Invalid input(s): "FREET3" Anemia work up No results for input(s): "VITAMINB12", "FOLATE", "FERRITIN", "TIBC", "IRON", "RETICCTPCT" in the last 72 hours. Urinalysis    Component Value Date/Time   COLORURINE YELLOW 03/05/2023 2147   APPEARANCEUR CLEAR 03/05/2023 2147   LABSPEC 1.024 03/05/2023 2147   PHURINE 5.0 03/05/2023 2147   GLUCOSEU NEGATIVE 03/05/2023 2147   HGBUR MODERATE (A) 03/05/2023 2147   BILIRUBINUR NEGATIVE 03/05/2023 2147   KETONESUR NEGATIVE 03/05/2023 2147   PROTEINUR NEGATIVE 03/05/2023 2147   NITRITE NEGATIVE 03/05/2023 2147   LEUKOCYTESUR NEGATIVE 03/05/2023 2147   Sepsis Labs Recent Labs  Lab 11/24/23 1547 11/30/23 0009 12/01/23 0639  WBC 10.4 7.0 5.8   Microbiology Recent Results (from the past 240 hours)  Anaerobic and Aerobic Culture     Status: Abnormal   Collection Time: 11/26/23 12:31 PM   Specimen: Wound   HD  Result Value Ref Range Status   Anaerobic Culture Final report  Final   Result 1 Comment  Final    Comment: No anaerobic growth in 72 hours.   Aerobic Culture Final report (A)  Final   Result 1 Comment (A)  Final    Comment: Methicillin - resistant Staphylococcus aureus Based on resistance to oxacillin this isolate would  be resistant to all currently available beta-lactam antimicrobial agents, with the exception of the newer cephalosporins with anti-MRSA activity, such as Ceftaroline Scant growth    Antimicrobial Susceptibility Comment  Final    Comment:       ** S = Susceptible; I = Intermediate; R = Resistant **                    P = Positive; N = Negative  MICS are expressed in micrograms per mL    Antibiotic                 RSLT#1    RSLT#2    RSLT#3    RSLT#4 Ciprofloxacin                  R Clindamycin                    S Erythromycin                   R Gentamicin                     S Levofloxacin                   I Linezolid                      S Oxacillin                      R Penicillin                     R Rifampin                       S Tetracycline                   S Trimethoprim/Sulfa             R Vancomycin                     S   MRSA Next Gen by PCR, Nasal     Status: None   Collection Time: 11/30/23 12:00 PM   Specimen: Nasal Mucosa; Nasal Swab  Result Value Ref Range Status   MRSA by PCR Next Gen NOT DETECTED NOT DETECTED Final    Comment: (NOTE) The GeneXpert MRSA Assay (FDA approved for NASAL specimens only), is one component of a comprehensive MRSA colonization surveillance program. It is not intended to diagnose MRSA infection nor to guide or monitor treatment for MRSA infections. Test performance is not FDA approved in patients less than 47 years old. Performed at [facility], [address] [place], [place] [address]      Time coordinating discharge: 35 minutes  SIGNED:   Erick Blinks, DO [facility] 12/01/2023, 10:48 AM  If 7PM-7AM, please contact night-coverage [email or web address]

## 2023-12-01 NOTE — Plan of Care (Signed)
  Problem: Education: Goal: Knowledge of General Education information will improve Description: Including pain rating scale, medication(s)/side effects and non-pharmacologic comfort measures 12/01/2023 1138 by Karolee Ohs, RN Outcome: Adequate for Discharge 12/01/2023 1137 by Karolee Ohs, RN Outcome: Progressing   Problem: Health Behavior/Discharge Planning: Goal: Ability to manage health-related needs will improve 12/01/2023 1138 by Karolee Ohs, RN Outcome: Adequate for Discharge 12/01/2023 1137 by Karolee Ohs, RN Outcome: Progressing   Problem: Clinical Measurements: Goal: Ability to maintain clinical measurements within normal limits will improve 12/01/2023 1138 by Karolee Ohs, RN Outcome: Adequate for Discharge 12/01/2023 1137 by Karolee Ohs, RN Outcome: Progressing Goal: Will remain free from infection Outcome: Adequate for Discharge Goal: Diagnostic test results will improve Outcome: Adequate for Discharge Goal: Respiratory complications will improve Outcome: Adequate for Discharge Goal: Cardiovascular complication will be avoided Outcome: Adequate for Discharge   Problem: Activity: Goal: Risk for activity intolerance will decrease Outcome: Adequate for Discharge   Problem: Nutrition: Goal: Adequate nutrition will be maintained Outcome: Adequate for Discharge   Problem: Coping: Goal: Level of anxiety will decrease Outcome: Adequate for Discharge   Problem: Elimination: Goal: Will not experience complications related to bowel motility Outcome: Adequate for Discharge Goal: Will not experience complications related to urinary retention Outcome: Adequate for Discharge   Problem: Pain Managment: Goal: General experience of comfort will improve and/or be controlled Outcome: Adequate for Discharge   Problem: Safety: Goal: Ability to remain free from injury will improve Outcome: Adequate for Discharge   Problem: Skin Integrity: Goal: Risk for impaired  skin integrity will decrease Outcome: Adequate for Discharge   Problem: Clinical Measurements: Goal: Ability to avoid or minimize complications of infection will improve Outcome: Adequate for Discharge   Problem: Skin Integrity: Goal: Skin integrity will improve Outcome: Adequate for Discharge

## 2023-12-01 NOTE — Plan of Care (Signed)
  Problem: Education: Goal: Knowledge of General Education information will improve Description: Including pain rating scale, medication(s)/side effects and non-pharmacologic comfort measures 12/01/2023 0700 by Charna Elizabeth, RN Outcome: Progressing 12/01/2023 0700 by Charna Elizabeth, RN Outcome: Progressing   Problem: Health Behavior/Discharge Planning: Goal: Ability to manage health-related needs will improve 12/01/2023 0700 by Charna Elizabeth, RN Outcome: Progressing 12/01/2023 0700 by Charna Elizabeth, RN Outcome: Progressing   Problem: Clinical Measurements: Goal: Ability to maintain clinical measurements within normal limits will improve 12/01/2023 0700 by Charna Elizabeth, RN Outcome: Progressing 12/01/2023 0700 by Charna Elizabeth, RN Outcome: Progressing Goal: Will remain free from infection 12/01/2023 0700 by Charna Elizabeth, RN Outcome: Progressing 12/01/2023 0700 by Charna Elizabeth, RN Outcome: Progressing Goal: Diagnostic test results will improve 12/01/2023 0700 by Charna Elizabeth, RN Outcome: Progressing 12/01/2023 0700 by Charna Elizabeth, RN Outcome: Progressing Goal: Respiratory complications will improve 12/01/2023 0700 by Charna Elizabeth, RN Outcome: Progressing 12/01/2023 0700 by Charna Elizabeth, RN Outcome: Progressing Goal: Cardiovascular complication will be avoided 12/01/2023 0700 by Charna Elizabeth, RN Outcome: Progressing 12/01/2023 0700 by Charna Elizabeth, RN Outcome: Progressing   Problem: Activity: Goal: Risk for activity intolerance will decrease 12/01/2023 0700 by Charna Elizabeth, RN Outcome: Progressing 12/01/2023 0700 by Charna Elizabeth, RN Outcome: Progressing   Problem: Nutrition: Goal: Adequate nutrition will be maintained 12/01/2023 0700 by Charna Elizabeth, RN Outcome: Progressing 12/01/2023 0700 by Charna Elizabeth, RN Outcome: Progressing   Problem: Coping: Goal: Level of anxiety will decrease 12/01/2023 0700 by Charna Elizabeth, RN Outcome: Progressing 12/01/2023 0700 by Charna Elizabeth, RN Outcome: Progressing   Problem: Elimination: Goal: Will not  experience complications related to bowel motility 12/01/2023 0700 by Charna Elizabeth, RN Outcome: Progressing 12/01/2023 0700 by Charna Elizabeth, RN Outcome: Progressing Goal: Will not experience complications related to urinary retention 12/01/2023 0700 by Charna Elizabeth, RN Outcome: Progressing 12/01/2023 0700 by Charna Elizabeth, RN Outcome: Progressing   Problem: Pain Managment: Goal: General experience of comfort will improve and/or be controlled 12/01/2023 0700 by Charna Elizabeth, RN Outcome: Progressing 12/01/2023 0700 by Charna Elizabeth, RN Outcome: Progressing   Problem: Safety: Goal: Ability to remain free from injury will improve 12/01/2023 0700 by Charna Elizabeth, RN Outcome: Progressing 12/01/2023 0700 by Charna Elizabeth, RN Outcome: Progressing   Problem: Skin Integrity: Goal: Risk for impaired skin integrity will decrease 12/01/2023 0700 by Charna Elizabeth, RN Outcome: Progressing 12/01/2023 0700 by Charna Elizabeth, RN Outcome: Progressing   Problem: Clinical Measurements: Goal: Ability to avoid or minimize complications of infection will improve 12/01/2023 0700 by Charna Elizabeth, RN Outcome: Progressing 12/01/2023 0700 by Charna Elizabeth, RN Outcome: Progressing   Problem: Skin Integrity: Goal: Skin integrity will improve 12/01/2023 0700 by Charna Elizabeth, RN Outcome: Progressing 12/01/2023 0700 by Charna Elizabeth, RN Outcome: Progressing

## 2023-12-01 NOTE — Plan of Care (Signed)

## 2023-12-05 DIAGNOSIS — M4319 Spondylolisthesis, multiple sites in spine: Secondary | ICD-10-CM | POA: Diagnosis not present

## 2023-12-05 DIAGNOSIS — M4317 Spondylolisthesis, lumbosacral region: Secondary | ICD-10-CM | POA: Diagnosis not present

## 2023-12-05 DIAGNOSIS — M4856XA Collapsed vertebra, not elsewhere classified, lumbar region, initial encounter for fracture: Secondary | ICD-10-CM | POA: Diagnosis not present

## 2023-12-05 DIAGNOSIS — M5137 Other intervertebral disc degeneration, lumbosacral region with discogenic back pain only: Secondary | ICD-10-CM | POA: Diagnosis not present

## 2023-12-05 DIAGNOSIS — M4807 Spinal stenosis, lumbosacral region: Secondary | ICD-10-CM | POA: Diagnosis not present

## 2023-12-10 DIAGNOSIS — M48061 Spinal stenosis, lumbar region without neurogenic claudication: Secondary | ICD-10-CM | POA: Diagnosis not present

## 2023-12-10 DIAGNOSIS — Z6829 Body mass index (BMI) 29.0-29.9, adult: Secondary | ICD-10-CM | POA: Diagnosis not present

## 2023-12-10 DIAGNOSIS — M4317 Spondylolisthesis, lumbosacral region: Secondary | ICD-10-CM | POA: Diagnosis not present

## 2024-01-04 ENCOUNTER — Telehealth: Payer: Self-pay | Admitting: Family Medicine

## 2024-01-04 NOTE — Telephone Encounter (Signed)
 [facility] pt not [facility]. Thanks

## 2024-01-04 NOTE — Telephone Encounter (Signed)
 Copied from CRM [id number]. Topic: Complaint (DO NOT CONVERT) - Billing/Coding >> Jan 04, 2024  3:28 PM Corinna Lines S wrote: DOS: 11/29/23-11/30/23 Details of complaint: pt on the line that was admitted to the hospital under the direction of the doc- pt recvd a letter from his insurance saying the INN pt stay will not be covered. Pt would like to have a detailed report of why he was advised to stay in the hospital so that he can present to insurance carrier. Would like the summary to be emailed to [email or web address]  How would the patient like to see this issue resolved? Pt would like to have a detailed report of why he was advised to stay in the hospital so that he can present to insurance carrier.     Route to Research officer, political party.

## 2024-01-07 ENCOUNTER — Other Ambulatory Visit: Payer: Self-pay | Admitting: Neurosurgery

## 2024-01-10 NOTE — Telephone Encounter (Signed)
 Pt made aware

## 2024-01-14 NOTE — Pre-Procedure Instructions (Signed)
 Surgical Instructions   Your procedure is scheduled on Jan 24, 2024. Report to [facility] Main Entrance "A" at 10:30 A.M., then check in with the Admitting office. Any questions or running late day of surgery: call [phone]  Questions prior to your surgery date: call [phone], Monday-Friday, 8am-4pm. If you experience any cold or flu symptoms such as cough, fever, chills, shortness of breath, etc. between now and your scheduled surgery, please notify us  at the above number.     Remember:  Do not eat after midnight the night before your surgery  You may drink clear liquids until 9:30 AM the morning of your surgery.   Clear liquids allowed are: Water, Non-Citrus Juices (without pulp), Carbonated Beverages, Clear Tea (no milk, honey, etc.), Black Coffee Only (NO MILK, CREAM OR POWDERED CREAMER of any kind), and Gatorade.    Take these medicines the morning of surgery with A SIP OF WATER: terbinafine  (LAMISIL )    May take these medicines IF NEEDED: cyclobenzaprine (FLEXERIL)  oxyCODONE  (ROXICODONE )  traMADol (ULTRAM)    One week prior to surgery, STOP taking any Aspirin (unless otherwise instructed by your surgeon) Aleve, Naproxen, Ibuprofen , Motrin , Advil , Goody's, BC's, all herbal medications, fish oil, and non-prescription vitamins.                     Do NOT Smoke (Tobacco/Vaping) for 24 hours prior to your procedure.  If you use a CPAP at night, you may bring your mask/headgear for your overnight stay.   You will be asked to remove any contacts, glasses, piercing's, hearing aid's, dentures/partials prior to surgery. Please bring cases for these items if needed.    Patients discharged the day of surgery will not be allowed to drive home, and someone needs to stay with them for 24 hours.  SURGICAL WAITING ROOM VISITATION Patients may have no more than 2 support people in the waiting area - these visitors may rotate.   Pre-op nurse will coordinate an appropriate time for  1 ADULT support person, who may not rotate, to accompany patient in pre-op.  Children under the age of 70 must have an adult with them who is not the patient and must remain in the main waiting area with an adult.  If the patient needs to stay at the hospital during part of their recovery, the visitor guidelines for inpatient rooms apply.  Please refer to the [facility] website for the visitor guidelines for any additional information.   If you received a COVID test during your pre-op visit  it is requested that you wear a mask when out in public, stay away from anyone that may not be feeling well and notify your surgeon if you develop symptoms. If you have been in contact with anyone that has tested positive in the last 10 days please notify you surgeon.      Pre-operative 5 CHG Bathing Instructions   You can play a key role in reducing the risk of infection after surgery. Your skin needs to be as free of germs as possible. You can reduce the number of germs on your skin by washing with CHG (chlorhexidine gluconate) soap before surgery. CHG is an antiseptic soap that kills germs and continues to kill germs even after washing.   DO NOT use if you have an allergy to chlorhexidine/CHG or antibacterial soaps. If your skin becomes reddened or irritated, stop using the CHG and notify one of our RNs at [phone].   Please shower with the  CHG soap starting 4 days before surgery using the following schedule:     Please keep in mind the following:  DO NOT shave, including legs and underarms, starting the day of your first shower.   You may shave your face at any point before/day of surgery.  Place clean sheets on your bed the day you start using CHG soap. Use a clean washcloth (not used since being washed) for each shower. DO NOT sleep with pets once you start using the CHG.   CHG Shower Instructions:  Wash your face and private area with normal soap. If you choose to wash your hair, wash  first with your normal shampoo.  After you use shampoo/soap, rinse your hair and body thoroughly to remove shampoo/soap residue.  Turn the water OFF and apply about 3 tablespoons (45 ml) of CHG soap to a CLEAN washcloth.  Apply CHG soap ONLY FROM YOUR NECK DOWN TO YOUR TOES (washing for 3-5 minutes)  DO NOT use CHG soap on face, private areas, open wounds, or sores.  Pay special attention to the area where your surgery is being performed.  If you are having back surgery, having someone wash your back for you may be helpful. Wait 2 minutes after CHG soap is applied, then you may rinse off the CHG soap.  Pat dry with a clean towel  Put on clean clothes/pajamas   If you choose to wear lotion, please use ONLY the CHG-compatible lotions that are listed below.  Additional instructions for the day of surgery: DO NOT APPLY any lotions, deodorants, cologne, or perfumes.   Do not bring valuables to the hospital. [facility] is not responsible for any belongings/valuables. Do not wear nail polish, gel polish, artificial nails, or any other type of covering on natural nails (fingers and toes) Do not wear jewelry or makeup Put on clean/comfortable clothes.  Please brush your teeth.  Ask your nurse before applying any prescription medications to the skin.     CHG Compatible Lotions   Aveeno Moisturizing lotion  Cetaphil Moisturizing Cream  Cetaphil Moisturizing Lotion  Clairol Herbal Essence Moisturizing Lotion, Dry Skin  Clairol Herbal Essence Moisturizing Lotion, Extra Dry Skin  Clairol Herbal Essence Moisturizing Lotion, Normal Skin  Curel Age Defying Therapeutic Moisturizing Lotion with Alpha Hydroxy  Curel Extreme Care Body Lotion  Curel Soothing Hands Moisturizing Hand Lotion  Curel Therapeutic Moisturizing Cream, Fragrance-Free  Curel Therapeutic Moisturizing Lotion, Fragrance-Free  Curel Therapeutic Moisturizing Lotion, Original Formula  Eucerin Daily Replenishing Lotion  Eucerin Dry  Skin Therapy Plus Alpha Hydroxy Crme  Eucerin Dry Skin Therapy Plus Alpha Hydroxy Lotion  Eucerin Original Crme  Eucerin Original Lotion  Eucerin Plus Crme Eucerin Plus Lotion  Eucerin TriLipid Replenishing Lotion  Keri Anti-Bacterial Hand Lotion  Keri Deep Conditioning Original Lotion Dry Skin Formula Softly Scented  Keri Deep Conditioning Original Lotion, Fragrance Free Sensitive Skin Formula  Keri Lotion Fast Absorbing Fragrance Free Sensitive Skin Formula  Keri Lotion Fast Absorbing Softly Scented Dry Skin Formula  Keri Original Lotion  Keri Skin Renewal Lotion Keri Silky Smooth Lotion  Keri Silky Smooth Sensitive Skin Lotion  Nivea Body Creamy Conditioning Oil  Nivea Body Extra Enriched Teacher, adult education Moisturizing Lotion Nivea Crme  Nivea Skin Firming Lotion  NutraDerm 30 Skin Lotion  NutraDerm Skin Lotion  NutraDerm Therapeutic Skin Cream  NutraDerm Therapeutic Skin Lotion  ProShield Protective Hand Cream  Provon moisturizing lotion  Please read over the following fact  sheets that you were given.

## 2024-01-15 ENCOUNTER — Encounter (HOSPITAL_COMMUNITY): Payer: Self-pay

## 2024-01-15 ENCOUNTER — Encounter (HOSPITAL_COMMUNITY)
Admission: RE | Admit: 2024-01-15 | Discharge: 2024-01-15 | Disposition: A | Discharge status: Home / Self Care | Source: Ambulatory Visit | Attending: Neurosurgery | Admitting: Neurosurgery

## 2024-01-15 ENCOUNTER — Other Ambulatory Visit: Payer: Self-pay

## 2024-01-15 VITALS — BP 136/80 | HR 70 | Temp 97.9°F | Resp 18 | Ht 72.0 in | Wt 198.0 lb

## 2024-01-15 DIAGNOSIS — Z01812 Encounter for preprocedural laboratory examination: Secondary | ICD-10-CM | POA: Diagnosis not present

## 2024-01-15 DIAGNOSIS — Z01818 Encounter for other preprocedural examination: Secondary | ICD-10-CM

## 2024-01-15 DIAGNOSIS — M4317 Spondylolisthesis, lumbosacral region: Secondary | ICD-10-CM | POA: Diagnosis not present

## 2024-01-15 HISTORY — DX: Unspecified osteoarthritis, unspecified site: M19.90

## 2024-01-15 HISTORY — DX: Headache, unspecified: R51.9

## 2024-01-15 HISTORY — DX: Pneumonia, unspecified organism: J18.9

## 2024-01-15 LAB — BASIC METABOLIC PANEL WITH GFR
Anion gap: 10 (ref 5–15)
BUN: 17 mg/dL (ref 6–20)
CO2: 29 mmol/L (ref 22–32)
Calcium: 9.8 mg/dL (ref 8.9–10.3)
Chloride: 107 mmol/L (ref 98–111)
Creatinine, Ser: 1.43 mg/dL — ABNORMAL HIGH (ref 0.61–1.24)
GFR, Estimated: 59 mL/min — ABNORMAL LOW (ref >60)
Glucose, Bld: 75 mg/dL (ref 70–99)
Potassium: 4.6 mmol/L (ref 3.5–5.1)
Sodium: 146 mmol/L — ABNORMAL HIGH (ref 135–145)

## 2024-01-15 LAB — CBC
HCT: 44 % (ref 39.0–52.0)
Hemoglobin: 14.6 g/dL (ref 13.0–17.0)
MCH: 29.7 pg (ref 26.0–34.0)
MCHC: 33.2 g/dL (ref 30.0–36.0)
MCV: 89.6 fL (ref 80.0–100.0)
Platelets: 273 10*3/uL (ref 150–400)
RBC: 4.91 MIL/uL (ref 4.22–5.81)
RDW: 12.2 % (ref 11.5–15.5)
WBC: 6.1 10*3/uL (ref 4.0–10.5)
nRBC: 0 % (ref 0.0–0.2)

## 2024-01-15 LAB — SURGICAL PCR SCREEN
MRSA, PCR: NEGATIVE
Staphylococcus aureus: NEGATIVE

## 2024-01-15 LAB — TYPE AND SCREEN
ABO/RH(D): O POS
Antibody Screen: NEGATIVE

## 2024-01-15 NOTE — Progress Notes (Signed)
 PCP - Jacqualyn Mates, FNP Cardiologist - Pt saw Dr. Vishnu Mallipeddi for CP 02/21/2023. Pt completed holter monitor and results were normal. No follow-up needed and no CP noted since that visit.  PPM/ICD - Denies Device Orders - n/a Rep Notified - n/a  Chest x-ray - n/a EKG - 12/28/2022 Stress Test - Denies ECHO - Denies Cardiac Cath - Denies Holter Monitor - 01/17/2023  Sleep Study - Denies CPAP - n/a  No DM  Last dose of GLP1 agonist- n/a GLP1 instructions: n/a  Blood Thinner Instructions: n/a Aspirin Instructions: n/a  ERAS Protcol - Clear liquids until 0930 morning of surgery PRE-SURGERY Ensure or G2- n/a  COVID TEST- n/a   Anesthesia review: No  Patient denies shortness of breath, fever, cough and chest pain at PAT appointment. Pt denies any respiratory illness/infection in the last two months.   All instructions explained to the patient, with a verbal understanding of the material. Patient agrees to go over the instructions while at home for a better understanding. Patient also instructed to self quarantine after being tested for COVID-19. The opportunity to ask questions was provided.

## 2024-01-24 ENCOUNTER — Ambulatory Visit (HOSPITAL_COMMUNITY): Admitting: Anesthesiology

## 2024-01-24 ENCOUNTER — Other Ambulatory Visit: Payer: Self-pay

## 2024-01-24 ENCOUNTER — Encounter (HOSPITAL_COMMUNITY)
Admission: RE | Disposition: A | Discharge status: Home / Self Care | Payer: Self-pay | Source: Home / Self Care | Attending: Neurosurgery

## 2024-01-24 ENCOUNTER — Ambulatory Visit (HOSPITAL_COMMUNITY)
Admission: RE | Admit: 2024-01-24 | Discharge: 2024-01-25 | Disposition: A | Discharge status: Home / Self Care | Attending: Neurosurgery | Admitting: Neurosurgery

## 2024-01-24 ENCOUNTER — Ambulatory Visit (HOSPITAL_COMMUNITY)

## 2024-01-24 ENCOUNTER — Ambulatory Visit (HOSPITAL_BASED_OUTPATIENT_CLINIC_OR_DEPARTMENT_OTHER): Admitting: Anesthesiology

## 2024-01-24 ENCOUNTER — Encounter (HOSPITAL_COMMUNITY): Payer: Self-pay | Admitting: Neurosurgery

## 2024-01-24 DIAGNOSIS — Z96652 Presence of left artificial knee joint: Secondary | ICD-10-CM | POA: Insufficient documentation

## 2024-01-24 DIAGNOSIS — Z9049 Acquired absence of other specified parts of digestive tract: Secondary | ICD-10-CM | POA: Insufficient documentation

## 2024-01-24 DIAGNOSIS — Z87891 Personal history of nicotine dependence: Secondary | ICD-10-CM | POA: Insufficient documentation

## 2024-01-24 DIAGNOSIS — M4807 Spinal stenosis, lumbosacral region: Secondary | ICD-10-CM | POA: Insufficient documentation

## 2024-01-24 DIAGNOSIS — Z9889 Other specified postprocedural states: Secondary | ICD-10-CM | POA: Diagnosis not present

## 2024-01-24 DIAGNOSIS — J189 Pneumonia, unspecified organism: Secondary | ICD-10-CM | POA: Diagnosis not present

## 2024-01-24 DIAGNOSIS — M4156 Other secondary scoliosis, lumbar region: Secondary | ICD-10-CM | POA: Diagnosis not present

## 2024-01-24 DIAGNOSIS — M4317 Spondylolisthesis, lumbosacral region: Secondary | ICD-10-CM

## 2024-01-24 DIAGNOSIS — Z79899 Other long term (current) drug therapy: Secondary | ICD-10-CM | POA: Diagnosis not present

## 2024-01-24 DIAGNOSIS — M48061 Spinal stenosis, lumbar region without neurogenic claudication: Secondary | ICD-10-CM | POA: Diagnosis not present

## 2024-01-24 DIAGNOSIS — Z981 Arthrodesis status: Secondary | ICD-10-CM | POA: Diagnosis not present

## 2024-01-24 LAB — ABO/RH: ABO/RH(D): O POS

## 2024-01-24 SURGERY — POSTERIOR LUMBAR FUSION 1 LEVEL
Anesthesia: General

## 2024-01-24 MED ORDER — FENTANYL CITRATE (PF) 250 MCG/5ML IJ SOLN
INTRAMUSCULAR | Status: DC | PRN
Start: 2024-01-24 — End: 2024-01-24
  Administered 2024-01-24 (×2): 50 ug via INTRAVENOUS
  Administered 2024-01-24: 150 ug via INTRAVENOUS

## 2024-01-24 MED ORDER — TERBINAFINE HCL 250 MG PO TABS
250.0000 mg | ORAL_TABLET | Freq: Every day | ORAL | Status: DC
  Administered 2024-01-24 – 2024-01-25 (×2): 250 mg via ORAL
  Filled 2024-01-24 (×2): qty 1

## 2024-01-24 MED ORDER — LIDOCAINE 2% (20 MG/ML) 5 ML SYRINGE
INTRAMUSCULAR | Status: DC | PRN
  Administered 2024-01-24: 100 mg via INTRAVENOUS

## 2024-01-24 MED ORDER — DEXMEDETOMIDINE HCL IN NACL 80 MCG/20ML IV SOLN
INTRAVENOUS | Status: AC
  Filled 2024-01-24: qty 20

## 2024-01-24 MED ORDER — CELECOXIB 200 MG PO CAPS
200.0000 mg | ORAL_CAPSULE | Freq: Two times a day (BID) | ORAL | Status: DC
  Administered 2024-01-24 – 2024-01-25 (×2): 200 mg via ORAL
  Filled 2024-01-24 (×2): qty 1

## 2024-01-24 MED ORDER — DEXMEDETOMIDINE HCL IN NACL 80 MCG/20ML IV SOLN
INTRAVENOUS | Status: DC | PRN
Start: 2024-01-24 — End: 2024-01-24
  Administered 2024-01-24 (×2): 4 ug via INTRAVENOUS
  Administered 2024-01-24: 8 ug via INTRAVENOUS

## 2024-01-24 MED ORDER — MIDAZOLAM HCL 2 MG/2ML IJ SOLN
INTRAMUSCULAR | Status: DC | PRN
  Administered 2024-01-24: 2 mg via INTRAVENOUS

## 2024-01-24 MED ORDER — ONDANSETRON HCL 4 MG PO TABS: 4.0000 mg | ORAL_TABLET | Freq: Four times a day (QID) | ORAL | Status: DC | PRN

## 2024-01-24 MED ORDER — BUPIVACAINE HCL (PF) 0.5 % IJ SOLN
INTRAMUSCULAR | Status: DC | PRN
  Administered 2024-01-24: 30 mL

## 2024-01-24 MED ORDER — ONDANSETRON HCL 4 MG/2ML IJ SOLN: 4.0000 mg | Freq: Once | INTRAMUSCULAR | Status: DC | PRN

## 2024-01-24 MED ORDER — SENNOSIDES-DOCUSATE SODIUM 8.6-50 MG PO TABS: 1.0000 | ORAL_TABLET | Freq: Every evening | ORAL | Status: DC | PRN

## 2024-01-24 MED ORDER — PHENOL 1.4 % MT LIQD: 1.0000 | OROMUCOSAL | Status: DC | PRN

## 2024-01-24 MED ORDER — SENNA 8.6 MG PO TABS
1.0000 | ORAL_TABLET | Freq: Two times a day (BID) | ORAL | Status: DC
  Administered 2024-01-24 – 2024-01-25 (×2): 8.6 mg via ORAL
  Filled 2024-01-24 (×2): qty 1

## 2024-01-24 MED ORDER — SODIUM CHLORIDE 0.9% FLUSH
3.0000 mL | Freq: Two times a day (BID) | INTRAVENOUS | Status: DC
  Administered 2024-01-24: 3 mL via INTRAVENOUS

## 2024-01-24 MED ORDER — MORPHINE SULFATE (PF) 4 MG/ML IV SOLN
4.0000 mg | INTRAVENOUS | Status: DC | PRN
  Administered 2024-01-24: 4 mg via INTRAVENOUS
  Filled 2024-01-24: qty 1

## 2024-01-24 MED ORDER — THROMBIN 20000 UNITS EX SOLR: CUTANEOUS | Status: DC | PRN

## 2024-01-24 MED ORDER — ACETAMINOPHEN 10 MG/ML IV SOLN: 1000.0000 mg | Freq: Once | INTRAVENOUS | Status: DC | PRN

## 2024-01-24 MED ORDER — BUPIVACAINE HCL (PF) 0.5 % IJ SOLN
INTRAMUSCULAR | Status: AC
  Filled 2024-01-24: qty 30

## 2024-01-24 MED ORDER — BISACODYL 5 MG PO TBEC: 5.0000 mg | DELAYED_RELEASE_TABLET | Freq: Every day | ORAL | Status: DC | PRN

## 2024-01-24 MED ORDER — FENTANYL CITRATE (PF) 100 MCG/2ML IJ SOLN
INTRAMUSCULAR | Status: AC
  Filled 2024-01-24: qty 2

## 2024-01-24 MED ORDER — SODIUM CHLORIDE 0.9 % IV SOLN: 250.0000 mL | INTRAVENOUS | Status: DC

## 2024-01-24 MED ORDER — OXYCODONE HCL 5 MG PO TABS
10.0000 mg | ORAL_TABLET | ORAL | Status: DC | PRN
  Administered 2024-01-24 – 2024-01-25 (×4): 10 mg via ORAL
  Filled 2024-01-24 (×4): qty 2

## 2024-01-24 MED ORDER — MENTHOL 3 MG MT LOZG: 1.0000 | LOZENGE | OROMUCOSAL | Status: DC | PRN

## 2024-01-24 MED ORDER — ROCURONIUM BROMIDE 10 MG/ML (PF) SYRINGE
PREFILLED_SYRINGE | INTRAVENOUS | Status: DC | PRN
  Administered 2024-01-24: 80 mg via INTRAVENOUS
  Administered 2024-01-24 (×5): 10 mg via INTRAVENOUS

## 2024-01-24 MED ORDER — ONDANSETRON HCL 4 MG/2ML IJ SOLN
INTRAMUSCULAR | Status: DC | PRN
  Administered 2024-01-24: 4 mg via INTRAVENOUS

## 2024-01-24 MED ORDER — POTASSIUM CHLORIDE IN NACL 20-0.9 MEQ/L-% IV SOLN
INTRAVENOUS | Status: DC
  Filled 2024-01-24: qty 1000

## 2024-01-24 MED ORDER — ONDANSETRON HCL 4 MG/2ML IJ SOLN: 4.0000 mg | Freq: Four times a day (QID) | INTRAMUSCULAR | Status: DC | PRN

## 2024-01-24 MED ORDER — MAGNESIUM CITRATE PO SOLN: 1.0000 | Freq: Once | ORAL | Status: DC | PRN

## 2024-01-24 MED ORDER — ZOLPIDEM TARTRATE 5 MG PO TABS: 5.0000 mg | ORAL_TABLET | Freq: Every evening | ORAL | Status: DC | PRN

## 2024-01-24 MED ORDER — LIDOCAINE-EPINEPHRINE 0.5 %-1:200000 IJ SOLN
INTRAMUSCULAR | Status: DC | PRN
  Administered 2024-01-24: 20 mL

## 2024-01-24 MED ORDER — PROPOFOL 10 MG/ML IV BOLUS
INTRAVENOUS | Status: AC
  Filled 2024-01-24: qty 20

## 2024-01-24 MED ORDER — HYDROMORPHONE HCL 1 MG/ML IJ SOLN
INTRAMUSCULAR | Status: DC | PRN
  Administered 2024-01-24 (×2): .25 mg via INTRAVENOUS

## 2024-01-24 MED ORDER — OXYCODONE HCL 5 MG/5ML PO SOLN: 5.0000 mg | Freq: Once | ORAL | Status: DC | PRN

## 2024-01-24 MED ORDER — VANCOMYCIN HCL 1000 MG IV SOLR
INTRAVENOUS | Status: DC | PRN
  Administered 2024-01-24: 1000 mg

## 2024-01-24 MED ORDER — LACTATED RINGERS IV SOLN: INTRAVENOUS | Status: DC

## 2024-01-24 MED ORDER — ALBUMIN HUMAN 5 % IV SOLN: INTRAVENOUS | Status: DC | PRN

## 2024-01-24 MED ORDER — ROCURONIUM BROMIDE 10 MG/ML (PF) SYRINGE
PREFILLED_SYRINGE | INTRAVENOUS | Status: AC
  Filled 2024-01-24: qty 10

## 2024-01-24 MED ORDER — LIDOCAINE 2% (20 MG/ML) 5 ML SYRINGE
INTRAMUSCULAR | Status: AC
  Filled 2024-01-24: qty 5

## 2024-01-24 MED ORDER — PROPOFOL 10 MG/ML IV BOLUS
INTRAVENOUS | Status: DC | PRN
  Administered 2024-01-24: 150 mg via INTRAVENOUS

## 2024-01-24 MED ORDER — SUGAMMADEX SODIUM 200 MG/2ML IV SOLN
INTRAVENOUS | Status: DC | PRN
  Administered 2024-01-24: 200 mg via INTRAVENOUS

## 2024-01-24 MED ORDER — THROMBIN 20000 UNITS EX SOLR
CUTANEOUS | Status: AC
  Filled 2024-01-24: qty 20000

## 2024-01-24 MED ORDER — CEFAZOLIN SODIUM-DEXTROSE 2-4 GM/100ML-% IV SOLN
2.0000 g | INTRAVENOUS | Status: AC
  Administered 2024-01-24 (×2): 2 g via INTRAVENOUS
  Filled 2024-01-24: qty 100

## 2024-01-24 MED ORDER — MIDAZOLAM HCL 2 MG/2ML IJ SOLN
INTRAMUSCULAR | Status: AC
  Filled 2024-01-24: qty 2

## 2024-01-24 MED ORDER — OXYCODONE HCL ER 10 MG PO T12A
10.0000 mg | EXTENDED_RELEASE_TABLET | Freq: Two times a day (BID) | ORAL | Status: DC
  Administered 2024-01-24 – 2024-01-25 (×2): 10 mg via ORAL
  Filled 2024-01-24 (×2): qty 1

## 2024-01-24 MED ORDER — ACETAMINOPHEN 650 MG RE SUPP: 650.0000 mg | RECTAL | Status: DC | PRN

## 2024-01-24 MED ORDER — ORAL CARE MOUTH RINSE: 15.0000 mL | Freq: Once | OROMUCOSAL | Status: AC

## 2024-01-24 MED ORDER — LIDOCAINE-EPINEPHRINE 0.5 %-1:200000 IJ SOLN
INTRAMUSCULAR | Status: AC
  Filled 2024-01-24: qty 50

## 2024-01-24 MED ORDER — ACETAMINOPHEN 325 MG PO TABS: 650.0000 mg | ORAL_TABLET | ORAL | Status: DC | PRN

## 2024-01-24 MED ORDER — CHLORHEXIDINE GLUCONATE 0.12 % MT SOLN
15.0000 mL | Freq: Once | OROMUCOSAL | Status: AC
  Administered 2024-01-24: 15 mL via OROMUCOSAL
  Filled 2024-01-24: qty 15

## 2024-01-24 MED ORDER — ONDANSETRON HCL 4 MG/2ML IJ SOLN
INTRAMUSCULAR | Status: AC
  Filled 2024-01-24: qty 2

## 2024-01-24 MED ORDER — FENTANYL CITRATE (PF) 100 MCG/2ML IJ SOLN
25.0000 ug | INTRAMUSCULAR | Status: DC | PRN
  Administered 2024-01-24 (×2): 50 ug via INTRAVENOUS

## 2024-01-24 MED ORDER — CEFAZOLIN SODIUM 1 G IJ SOLR
INTRAMUSCULAR | Status: AC
  Filled 2024-01-24: qty 20

## 2024-01-24 MED ORDER — DEXAMETHASONE SODIUM PHOSPHATE 10 MG/ML IJ SOLN
INTRAMUSCULAR | Status: AC
  Filled 2024-01-24: qty 1

## 2024-01-24 MED ORDER — 0.9 % SODIUM CHLORIDE (POUR BTL) OPTIME
TOPICAL | Status: DC | PRN
  Administered 2024-01-24: 1000 mL

## 2024-01-24 MED ORDER — DIPHENHYDRAMINE HCL 25 MG PO CAPS: 25.0000 mg | ORAL_CAPSULE | Freq: Every day | ORAL | Status: DC | PRN

## 2024-01-24 MED ORDER — EPHEDRINE SULFATE-NACL 50-0.9 MG/10ML-% IV SOSY
PREFILLED_SYRINGE | INTRAVENOUS | Status: DC | PRN
  Administered 2024-01-24: 5 mg via INTRAVENOUS
  Administered 2024-01-24: 10 mg via INTRAVENOUS
  Administered 2024-01-24: 5 mg via INTRAVENOUS

## 2024-01-24 MED ORDER — CHLORHEXIDINE GLUCONATE CLOTH 2 % EX PADS: 6.0000 | MEDICATED_PAD | Freq: Once | CUTANEOUS | Status: DC

## 2024-01-24 MED ORDER — DIAZEPAM 5 MG PO TABS
5.0000 mg | ORAL_TABLET | Freq: Four times a day (QID) | ORAL | Status: DC | PRN
  Administered 2024-01-24 – 2024-01-25 (×3): 5 mg via ORAL
  Filled 2024-01-24 (×3): qty 1

## 2024-01-24 MED ORDER — DEXAMETHASONE SODIUM PHOSPHATE 10 MG/ML IJ SOLN
INTRAMUSCULAR | Status: DC | PRN
  Administered 2024-01-24: 5 mg via INTRAVENOUS

## 2024-01-24 MED ORDER — FENTANYL CITRATE (PF) 250 MCG/5ML IJ SOLN
INTRAMUSCULAR | Status: AC
  Filled 2024-01-24: qty 5

## 2024-01-24 MED ORDER — OXYCODONE HCL 5 MG PO TABS: 5.0000 mg | ORAL_TABLET | Freq: Once | ORAL | Status: DC | PRN

## 2024-01-24 MED ORDER — HYDROMORPHONE HCL 1 MG/ML IJ SOLN
INTRAMUSCULAR | Status: AC
  Filled 2024-01-24: qty 0.5

## 2024-01-24 MED ORDER — OXYCODONE HCL 5 MG PO TABS: 5.0000 mg | ORAL_TABLET | ORAL | Status: DC | PRN

## 2024-01-24 MED ORDER — SODIUM CHLORIDE 0.9% FLUSH: 3.0000 mL | INTRAVENOUS | Status: DC | PRN

## 2024-01-24 SURGICAL SUPPLY — 59 items
BAG COUNTER SPONGE SURGICOUNT (BAG) ×1 IMPLANT
BASKET BONE COLLECTION (BASKET) ×1 IMPLANT
BENZOIN TINCTURE PRP APPL 2/3 (GAUZE/BANDAGES/DRESSINGS) IMPLANT
BLADE BONE MILL MEDIUM (MISCELLANEOUS) ×1 IMPLANT
BLADE CLIPPER SURG (BLADE) IMPLANT
BUR MATCHSTICK NEURO 3.0 LAGG (BURR) ×1 IMPLANT
BUR PRECISION FLUTE 5.0 (BURR) ×1 IMPLANT
CANISTER SUCT 3000ML PPV (MISCELLANEOUS) ×1 IMPLANT
CAP RELINE MOD TULIP RMM (Cap) IMPLANT
CNTNR URN SCR LID CUP LEK RST (MISCELLANEOUS) ×1 IMPLANT
COVER BACK TABLE 60X90IN (DRAPES) ×1 IMPLANT
DERMABOND ADVANCED .7 DNX12 (GAUZE/BANDAGES/DRESSINGS) ×1 IMPLANT
DRAPE C-ARM 42X72 X-RAY (DRAPES) ×2 IMPLANT
DRAPE C-ARMOR (DRAPES) IMPLANT
DRAPE LAPAROTOMY 100X72X124 (DRAPES) ×1 IMPLANT
DRAPE SURG 17X23 STRL (DRAPES) ×1 IMPLANT
DRSG OPSITE POSTOP 4X8 (GAUZE/BANDAGES/DRESSINGS) IMPLANT
DURAPREP 26ML APPLICATOR (WOUND CARE) ×1 IMPLANT
ELECTRODE BLDE 4.0 EZ CLN MEGD (MISCELLANEOUS) IMPLANT
ELECTRODE REM PT RTRN 9FT ADLT (ELECTROSURGICAL) ×1 IMPLANT
GAUZE 4X4 16PLY ~~LOC~~+RFID DBL (SPONGE) IMPLANT
GAUZE SPONGE 4X4 12PLY STRL (GAUZE/BANDAGES/DRESSINGS) IMPLANT
GLOVE EXAM NITRILE XL STR (GLOVE) IMPLANT
GLOVE SURG LTX SZ6.5 (GLOVE) ×2 IMPLANT
GOWN STRL REUS W/ TWL LRG LVL3 (GOWN DISPOSABLE) ×2 IMPLANT
GOWN STRL REUS W/ TWL XL LVL3 (GOWN DISPOSABLE) IMPLANT
GOWN STRL REUS W/TWL 2XL LVL3 (GOWN DISPOSABLE) IMPLANT
KIT BASIN OR (CUSTOM PROCEDURE TRAY) ×1 IMPLANT
KIT POSITION SURG JACKSON T1 (MISCELLANEOUS) ×1 IMPLANT
KIT TURNOVER KIT B (KITS) ×1 IMPLANT
MILL BONE PREP (MISCELLANEOUS) IMPLANT
NDL HYPO 25X1 1.5 SAFETY (NEEDLE) ×1 IMPLANT
NDL SPNL 18GX3.5 QUINCKE PK (NEEDLE) IMPLANT
NEEDLE HYPO 25X1 1.5 SAFETY (NEEDLE) ×1 IMPLANT
NEEDLE SPNL 18GX3.5 QUINCKE PK (NEEDLE) IMPLANT
NS IRRIG 1000ML POUR BTL (IV SOLUTION) ×1 IMPLANT
PACK LAMINECTOMY NEURO (CUSTOM PROCEDURE TRAY) ×1 IMPLANT
PAD ARMBOARD POSITIONER FOAM (MISCELLANEOUS) ×2 IMPLANT
PATTIES SURGICAL .5 X.5 (GAUZE/BANDAGES/DRESSINGS) ×1 IMPLANT
PATTIES SURGICAL 1X1 (DISPOSABLE) ×1 IMPLANT
ROD RELINE COCR LORD 5.0X35 (Rod) IMPLANT
ROD RELINE COCR LORD 5X30 (Rod) IMPLANT
SCREW LOCK RSS 4.5/5.0MM (Screw) IMPLANT
SCREW SHANK RELINE MOD 5.5X30 (Screw) IMPLANT
SCREW SHANK RELINE MOD 5.5X35 (Screw) IMPLANT
SHANK RELINE MOD 5.5X40 (Screw) IMPLANT
SPIKE FLUID TRANSFER (MISCELLANEOUS) ×1 IMPLANT
SPONGE SURGIFOAM ABS GEL 100 (HEMOSTASIS) ×1 IMPLANT
SPONGE T-LAP 4X18 ~~LOC~~+RFID (SPONGE) IMPLANT
STRIP CLOSURE SKIN 1/2X4 (GAUZE/BANDAGES/DRESSINGS) IMPLANT
SUT PROLENE 6 0 BV (SUTURE) IMPLANT
SUT VIC AB 0 CT1 18XCR BRD8 (SUTURE) ×1 IMPLANT
SUT VIC AB 2-0 CT1 18 (SUTURE) ×1 IMPLANT
SUT VIC AB 3-0 SH 8-18 (SUTURE) ×1 IMPLANT
SYR 30ML SLIP (SYRINGE) ×1 IMPLANT
TOWEL GREEN STERILE (TOWEL DISPOSABLE) ×1 IMPLANT
TOWEL GREEN STERILE FF (TOWEL DISPOSABLE) ×1 IMPLANT
TRAY FOLEY MTR SLVR 16FR STAT (SET/KITS/TRAYS/PACK) ×1 IMPLANT
WATER STERILE IRR 1000ML POUR (IV SOLUTION) ×1 IMPLANT

## 2024-01-24 NOTE — Anesthesia Postprocedure Evaluation (Signed)
 Anesthesia Post Note  Patient: Greg Mason  Procedure(s) Performed: POSTERIOR LUMBAR FUSION LUMBAR FIVE-SACRAL ONE, LUMBAR THREE-FOUR DECOMPRESSION     Patient location during evaluation: PACU Anesthesia Type: General Level of consciousness: awake and alert Pain management: pain level controlled Vital Signs Assessment: post-procedure vital signs reviewed and stable Respiratory status: spontaneous breathing, nonlabored ventilation, respiratory function stable and patient connected to nasal cannula oxygen Cardiovascular status: blood pressure returned to baseline and stable Postop Assessment: no apparent nausea or vomiting Anesthetic complications: no   No notable events documented.  Last Vitals:  Vitals:   01/24/24 1930 01/24/24 2003  BP: (!) 145/93 (!) 148/89  Pulse: 81 (!) 58  Resp: 19 18  Temp:  36.6 C  SpO2: 98% 100%    Last Pain:  Vitals:   01/24/24 2124  TempSrc:   PainSc: 9                  Raymond Azure P Garnet Over[place]

## 2024-01-24 NOTE — H&P (Signed)
 BP 135/87   Pulse (!) 54   Temp 97.6 F (36.4 C) (Oral)   Resp 17   Ht 6' (1.829 m)   Wt 89 kg   SpO2 100%   BMI 26.61 kg/m  Greg Mason comes in today for evaluation of pain, which he has had since 2023.  He had gone to the ED at that time for back pain and told that he had a fracture.  He says he was never told that, but he did find that on MyChart.  He says the pain used to wax and wane, but now the pain is just persistent and has been the case for the last 6 to 7 months.  It has been far worse during that period of time also.  He denies any bowel or bladder issues.  Says that really bad pain that he has will radiate into the hamstrings, feels like he is cramping.  He says the worst episode lasts at approximately 10 minutes.  He states that after the 10 minutes, the pain subsided.  He has some difficulty standing.  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  He has had physical therapy.  He has had prednisone , but none of these things seem to have helped.     He weighs 208 pounds.  Blood pressure is 136/87, pulse is 65.  Pain is 9/10.  On his intake and registration, he states that the pain originally started gradually.  The back pain is worse than the extremity pain.  He can barely sit, walk or stand, certainly not more than 5 minutes without severe pain.  Says the back pain feels aching, burning, heavy and shooting.  The injury actually occurred October 28, 2021.  Denies any bowel or bladder dysfunction.  Denies sexual dysfunction.  Does take Viagra  but for a different reason.  He does have some weakness in the extremities and some headaches.  He has undergone an appendectomy, a herniorrhaphy, and had a knee replacement on the left.  He lives alone, does have children.  Formerly used snuff, stopped at age 61, started age 17.  Denies alcohol use.  He uses Terbinafine  once per day.  No known drug allergies.  He is right handed.   On exam, he is  alert, oriented by 4, and he answers all questions appropriately.  He has a markedly antalgic gait and moves very slowly. He grunts and he groans when he has to sit down or stand up.  Reflexes 2+ at the knees and ankles and upper extremities.  Pupils equal, round, and reactive to light.  Full extraocular movements.  Full visual fields.  He has fractures seemingly from T12, L1, L2 and L3.  He has some degenerative scoliosis.     No other imaging other than the plain x-ray.   He has some anterolisthesis of L5 on S1 for a grade 1 spondylolisthesis.  Retrolisthesis of L2 on L3 and L3 on L4.  He also has a mild compression deformity at L1.  Greg Mason returns today with an MRI.  What it shows is he has an old compression fracture of L1.  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  He is also stenotic at L3-4 with ligamentous hypertrophy and facet arthropathy.  He needs to be decompressed at 3-4 and he needs to be fused at 5-1.  I cannot say I believe he  has to be fused at 3-4.  I do not think a 3 level arthrodesis since he does have facet arthropathy present at L4-5.  But a simple decompression at 3-4 and a PLIF at 5-1, if indeed a cage could even be placed at the 5-1 disc space which is significantly collapsed.  I did give him pain medication today, Oxycodone .  He's going to call with a central calendar to let me know when he would like to do this, but he would like to proceed.     He is alert and oriented x4, antalgic gait.  Normal muscle tone, bulk, coordination.  Speech is clear and fluent.  Tongue and uvula in the midline.  He weighs 215 pounds, temperature is 98.8, blood pressure 139/81, pulse is 76.  Pain is 8/10.

## 2024-01-24 NOTE — Transfer of Care (Signed)
 Immediate Anesthesia Transfer of Care Note  Patient: Greg Mason  Procedure(s) Performed: POSTERIOR LUMBAR FUSION LUMBAR FIVE-SACRAL ONE, LUMBAR THREE-FOUR DECOMPRESSION  Patient Location: PACU  Anesthesia Type:General  Level of Consciousness: awake, alert , and oriented  Airway & Oxygen Therapy: Patient Spontanous Breathing and Patient connected to face mask oxygen  Post-op Assessment: Report given to RN and Post -op Vital signs reviewed and stable  Post vital signs: Reviewed and stable  Last Vitals:  Vitals Value Taken Time  BP 150/95 01/24/24 1833  Temp    Pulse 84 01/24/24 1836  Resp 10 01/24/24 1836  SpO2 99 % 01/24/24 1836  Vitals shown include unfiled device data.  Last Pain:  Vitals:   01/24/24 1052  TempSrc:   PainSc: 8          Complications: No notable events documented.

## 2024-01-24 NOTE — Op Note (Signed)
 01/24/2024  6:38 PM  PATIENT:  Greg Mason  53 y.o. male With a grade l L5/ S1 spondylolisthesis, and lumbar stenosis at L3/4. PRE-OPERATIVE DIAGNOSIS:  Spondylolisthesis lumbosacral region Lumbar stenosis L3/4 POST-OPERATIVE DIAGNOSIS:  Spondylolisthesis lumbosacral region  PROCEDURE:  Procedure(s): POSTERIOR LUMBAR FUSION LUMBAR FIVE-SACRAL ONE Gill procedure L5/S1 Non segmental pedicle screw fixation (Nuvasive, mas plif) L5/S1,  LUMBAR THREE laminectomy   SURGEON:  Surgeon(s): Audie Bleacher, MD  ASSISTANTS:Jenkins, Susana Enter  ANESTHESIA:   general  EBL:  Total I/O In: 2150 [I.V.:1800; IV Piggyback:350] Out: 100 [Blood:100]  BLOOD ADMINISTERED:none  CELL SAVER GIVEN:none  COUNT:per nursing  DRAINS: Urinary Catheter (Foley)   SPECIMEN:  No Specimen  DICTATION: Greg Mason is a 53 y.o. male whom was taken to the operating room intubated, and placed under a general anesthetic without difficulty. A foley catheter was placed under sterile conditions. He was positioned prone on a Jackson table with all pressure points properly padded.  His lumbar region was prepped and draped in a sterile manner. I infiltrated 20cc's 1/2%lidocaine /1:2000,000 strength epinephrine  into the planned incision. I opened the skin with a 10 blade and took the incision down to the thoracolumbar fascia. I exposed the lamina of L3,4,5,and S1 in a subperiosteal fashion bilaterally. I confirmed my location with an intraoperative xray.  I placed self retaining retractors and started the decompression.  I decompressed the spinal canal at L3/4 and at L5/S1 I performed a laminectomy of L3 to decompress the canal and the L3, L4 nerve roots At L5 I performed a Gill procedure to decompress the spinal canal and the L5 and S1 roots. I performed partial sacral facetectomies to decompress the L5 roots in the lateral recess and neural foramen I decorticated the lateral bone at L5 and S1. I then placed autograft morsels  on the decorticated surfaces to complete the posterolateral arthrodesis.  I placed pedicle screws at L5 and S1, using fluoroscopic guidance. I drilled a pilot hole, then cannulated the pedicle with a drill at each site. I then tapped each pedicle, assessing each site for pedicle violations. No cutouts were appreciated. Screws Koren Persons) were then placed at each site without difficulty. I attached rods and locking caps with the appropriate tools. The locking caps were secured with torque limited screwdrivers. Final films were performed and the final construct appeared to be in good position.  We closed the wound in a layered fashion. We approximated the thoracolumbar fascia, subcutaneous, and subcuticular planes with vicryl sutures. I used dermabond and an occlusive bandage for a sterile dressing.     PLAN OF CARE: Admit to inpatient   PATIENT DISPOSITION:  PACU - hemodynamically stable.   Delay start of Pharmacological VTE agent (>24hrs) due to surgical blood loss or risk of bleeding:  yes

## 2024-01-24 NOTE — Anesthesia Preprocedure Evaluation (Signed)
 Anesthesia Evaluation  Patient identified by MRN, date of birth, ID band Patient awake    Reviewed: Allergy & Precautions, NPO status , Patient's Chart, lab work & pertinent test results, reviewed documented beta blocker date and time   History of Anesthesia Complications Negative for: history of anesthetic complications  Airway Mallampati: II  TM Distance: >3 FB     Dental no notable dental hx.    Pulmonary pneumonia, resolved, neg COPD, neg recent URI, neg PE   breath sounds clear to auscultation       Cardiovascular (-) angina (-) CAD, (-) Past MI and (-) Cardiac Stents  Rhythm:Regular Rate:Normal     Neuro/Psych  Headaches    GI/Hepatic ,neg GERD  ,,(+) neg Cirrhosis        Endo/Other  neg diabetes    Renal/GU Renal disease     Musculoskeletal  (+) Arthritis ,    Abdominal   Peds  Hematology   Anesthesia Other Findings   Reproductive/Obstetrics                              Anesthesia Physical Anesthesia Plan  ASA: 2  Anesthesia Plan: General   Post-op Pain Management:    Induction: Intravenous  PONV Risk Score and Plan: 1 and 2 and Ondansetron  and Dexamethasone   Airway Management Planned: Oral ETT  Additional Equipment:   Intra-op Plan:   Post-operative Plan: Extubation in OR  Informed Consent: I have reviewed the patients History and Physical, chart, labs and discussed the procedure including the risks, benefits and alternatives for the proposed anesthesia with the patient or authorized representative who has indicated his/her understanding and acceptance.     Dental advisory given  Plan Discussed with: CRNA  Anesthesia Plan Comments:          Anesthesia Quick Evaluation

## 2024-01-24 NOTE — Anesthesia Procedure Notes (Signed)
 Procedure Name: Intubation Date/Time: 01/24/2024 1:04 PM  Performed by: Britnee Mcdevitt J, CRNAPre-anesthesia Checklist: Patient identified, Emergency Drugs available, Suction available and Patient being monitored Patient Re-evaluated:Patient Re-evaluated prior to induction Oxygen Delivery Method: Circle System Utilized Preoxygenation: Pre-oxygenation with 100% oxygen Induction Type: IV induction Ventilation: Mask ventilation without difficulty Laryngoscope Size: Miller and 3 Grade View: Grade I Tube type: Oral Tube size: 8.0 mm Number of attempts: 1 Airway Equipment and Method: Stylet and Oral airway Placement Confirmation: ETT inserted through vocal cords under direct vision, positive ETCO2 and breath sounds checked- equal and bilateral Secured at: 23 cm Tube secured with: Tape Dental Injury: Teeth and Oropharynx as per pre-operative assessment

## 2024-01-25 DIAGNOSIS — M4317 Spondylolisthesis, lumbosacral region: Secondary | ICD-10-CM | POA: Diagnosis not present

## 2024-01-25 MED ORDER — DIAZEPAM 5 MG PO TABS: 5.0000 mg | ORAL_TABLET | Freq: Four times a day (QID) | ORAL | 0 refills | Status: AC | PRN

## 2024-01-25 MED ORDER — OXYCODONE HCL 5 MG PO TABS: 5.0000 mg | ORAL_TABLET | Freq: Four times a day (QID) | ORAL | 0 refills | Status: AC | PRN

## 2024-01-25 NOTE — Evaluation (Signed)
 Physical Therapy Evaluation  Patient Details Name: Greg Mason MRN: [id number] DOB: April 19, 1971 Today's Date: 01/25/2024  History of Present Illness  Pt is a 53 y/o male who presents s/p L5-S1 PLIF and L3 laminectomy on 01/24/2024. PMH significant for L ankle fracture fixation, L TKA, umbilical hernia repair.  Clinical Impression  Pt admitted with above diagnosis. At the time of PT eval, pt was able to demonstrate transfers and ambulation with CGA to mod I and [facility] for support. Pt was educated on precautions, positioning recommendations, appropriate activity progression, and car transfer. Pt currently with functional limitations due to the deficits listed below (see PT Problem List). Pt will benefit from skilled PT to increase their independence and safety with mobility to allow discharge to the venue listed below.          If plan is discharge home, recommend the following: A little help with walking and/or transfers;A little help with bathing/dressing/bathroom;Assistance with cooking/housework;Assist for transportation;Help with stairs or ramp for entrance   Can travel by private vehicle        Equipment Recommendations Cane  Recommendations for Other Services       Functional Status Assessment Patient has had a recent decline in their functional status and demonstrates the ability to make significant improvements in function in a reasonable and predictable amount of time.     Precautions / Restrictions Precautions Precautions: Fall;Back Precaution Booklet Issued: Yes (comment) Recall of Precautions/Restrictions: Intact Required Braces or Orthoses:  (No brace needed order) Restrictions Weight Bearing Restrictions Per Provider Order: No      Mobility  Bed Mobility Overal bed mobility: Modified Independent Bed Mobility: Rolling, Sidelying to Sit, Sit to Sidelying           General bed mobility comments: HOB flat and rails lowered to simulate home environment.     Transfers Overall transfer level: Needs assistance Equipment used: None, Straight cane Transfers: Sit to/from Stand Sit to Stand: Contact guard assist           General transfer comment: Light guard as pt powered up to full stand. No assist required.    Ambulation/Gait Ambulation/Gait assistance: Contact guard assist Gait Distance (Feet): 300 Feet Assistive device: Straight cane, None Gait Pattern/deviations: Step-through pattern, Decreased stride length, Trunk flexed Gait velocity: Decreased Gait velocity interpretation: 1.31 - 2.62 ft/sec, indicative of limited community ambulator   General Gait Details: VC's for improved posture and forward gaze. No assist required. Pt mildly unsteady. Initially offered a SPC but pt declined. Later was agreeable to trying it and gait pattern was improved.  Stairs Stairs: Yes Stairs assistance: Contact guard assist Stair Management: Two rails, Step to pattern, Forwards Number of Stairs: 4 General stair comments: VC's for sequencing and general safety.  Wheelchair Mobility     Tilt Bed    Modified Rankin (Stroke Patients Only)       Balance Overall balance assessment: Needs assistance Sitting-balance support: Feet supported, No upper extremity supported Sitting balance-Leahy Scale: Good     Standing balance support: No upper extremity supported, During functional activity Standing balance-Leahy Scale: Poor Standing balance comment: Unsteady but able to recover from LOB without assist.                             Pertinent Vitals/Pain Pain Assessment Pain Assessment: Faces Faces Pain Scale: Hurts even more Pain Location: Incision site Pain Descriptors / Indicators: Operative site guarding, Sore Pain Intervention(s): Limited activity within patient's  tolerance, Monitored during session, Repositioned    Home Living Family/patient expects to be discharged to:: Private residence Living Arrangements:  Non-relatives/Friends Available Help at Discharge: Friend(s);Available PRN/intermittently Type of Home: House Home Access: Stairs to enter Entrance Stairs-Rails: Right;Left;Can reach both Entrance Stairs-Number of Steps: 4   Home Layout: One level Home Equipment: None      Prior Function Prior Level of Function : Independent/Modified Independent;Working/employed;Driving                     Extremity/Trunk Assessment   Upper Extremity Assessment Upper Extremity Assessment: Overall WFL for tasks assessed    Lower Extremity Assessment Lower Extremity Assessment: LLE deficits/detail LLE Deficits / Details: Pt reports prior knee replacement and with residual "limp" on that side.    Cervical / Trunk Assessment Cervical / Trunk Assessment: Back Surgery  Communication   Communication Communication: No apparent difficulties    Cognition Arousal: Alert Behavior During Therapy: WFL for tasks assessed/performed   PT - Cognitive impairments: No apparent impairments                         Following commands: Intact       Cueing Cueing Techniques: Verbal cues, Gestural cues     General Comments      Exercises     Assessment/Plan    PT Assessment Patient needs continued PT services  PT Problem List Decreased strength;Decreased balance;Decreased activity tolerance;Decreased safety awareness;Decreased knowledge of precautions;Pain       PT Treatment Interventions DME instruction;Gait training;Stair training;Functional mobility training;Therapeutic activities;Therapeutic exercise;Balance training;Patient/family education    PT Goals (Current goals can be found in the Care Plan section)  Acute Rehab PT Goals Patient Stated Goal: Home today, eventually get back to work PT Goal Formulation: With patient Time For Goal Achievement: 02/01/24 Potential to Achieve Goals: Good    Frequency Min 5X/week     Co-evaluation               AM-PAC PT "6  Clicks" Mobility  Outcome Measure Help needed turning from your back to your side while in a flat bed without using bedrails?: A Little Help needed moving from lying on your back to sitting on the side of a flat bed without using bedrails?: A Little Help needed moving to and from a bed to a chair (including a wheelchair)?: A Little Help needed standing up from a chair using your arms (e.g., wheelchair or bedside chair)?: A Little Help needed to walk in hospital room?: A Little Help needed climbing 3-5 steps with a railing? : A Little 6 Click Score: 18    End of Session Equipment Utilized During Treatment: Gait belt Activity Tolerance: Patient tolerated treatment well Patient left: in bed;with call bell/phone within reach;with family/visitor present Nurse Communication: Mobility status PT Visit Diagnosis: Unsteadiness on feet (R26.81);Pain Pain - part of body:  (back)    Time: 4782-9562 PT Time Calculation (min) (ACUTE ONLY): 17 min   Charges:   PT Evaluation $PT Eval Low Complexity: 1 Low   PT General Charges $$ ACUTE PT VISIT: 1 Visit         Simone Dubois, PT, DPT Acute Rehabilitation Services Secure Chat Preferred Office: [phone]   Venus Ginsberg 01/25/2024, 11:15 AM

## 2024-01-25 NOTE — Discharge Instructions (Addendum)
 Wound Care Remove dressing in 3 days Leave incision open to air. You may shower. Do not scrub directly on incision.  Do not put any creams, lotions, or ointments on incision. Activity Walk each and every day, increasing distance each day. No lifting greater than 8 lbs.  Avoid bending, arching, and twisting. No driving for 2 weeks; may ride as a passenger locally.  Diet Resume your normal diet.  Return to Work Will be discussed at you follow up appointment. Call Your Doctor If Any of These Occur Redness, drainage, or swelling at the wound.  Temperature greater than 101 degrees. Severe pain not relieved by pain medication. Incision starts to come apart. Follow Up Appt Call today for appointment in 3-4 weeks ([phone]) or for problems.  If you have any hardware placed in your spine, you will need an x-ray before your appointment.            Spinal Fusion Care After Refer to this sheet in the next few weeks. These instructions provide you with information on caring for yourself after your procedure. Your caregiver may also give you more specific instructions. Your treatment has been planned according to current medical practices, but problems sometimes occur. Call your caregiver if you have any problems or questions after your procedure. HOME CARE INSTRUCTIONS  Take whatever pain medicine has been prescribed by your caregiver. Do not take over-the-counter pain medicine unless directed otherwise by your caregiver.  Do not drive if you are taking narcotic pain medicines.  Change your bandage (dressing) if necessary or as directed by your caregiver.  You may shower. The wound may get wet, simply pat the area dry. It will take ~2 weeks for the glue to peel off. If you have been prescribed medicine to prevent your blood from clotting, follow the directions carefully.  Check the area around your incision often. Look for redness and swelling. Also, look for anything leaking from your  wound. You can use a mirror or have a family member inspect your incision if it is in a place where it is difficult for you to see.  Ask your caregiver what activities you should avoid and for how long.  Walk as much as possible.  Do not lift anything heavier than 5 lbs until your caregiver says it is safe.  Do not twist or bend for a few weeks. Try not to pull on things. Avoid sitting for long periods of time. Change positions at least every hour.

## 2024-01-25 NOTE — Evaluation (Signed)
 Occupational Therapy Evaluation and Discharge Patient Details Name: Greg Mason MRN: [id number] DOB: Jul 17, 1971 Today's Date: 01/25/2024   History of Present Illness   Pt is a 53 y/o male who presents s/p L5-S1 PLIF and L3 laminectomy on 01/24/2024. PMH significant for L ankle fracture fixation, L TKA, umbilical hernia repair.     Clinical Impressions This 53 yo male admitted and underwent above presents to acute OT with all education completed and post op handout provided and reviewed with friend in room. No further OT needs, we will sign off.     If plan is discharge home, recommend the following:   Assistance with cooking/housework;Assist for transportation     Functional Status Assessment   Patient has had a recent decline in their functional status and demonstrates the ability to make significant improvements in function in a reasonable and predictable amount of time. (withtout further need for skilled OT)     Equipment Recommendations   None recommended by OT      Precautions/Restrictions   Precautions Precautions: Fall;Back Precaution Booklet Issued: Yes (comment) Recall of Precautions/Restrictions: Intact Required Braces or Orthoses:  (no brace needed per order) Restrictions Weight Bearing Restrictions Per Provider Order: No     Mobility Bed Mobility               General bed mobility comments: Pt sitting EOB upon arrival    Transfers Overall transfer level: Modified independent                 General transfer comment: Good sit<>stand stance      Balance Overall balance assessment: Mild deficits observed, not formally tested                                         ADL either performed or assessed with clinical judgement   ADL                                         General ADL Comments: Educated on use of 2 cups for mouth care to avoid bending over sink, use of wet wipes for back peri care to  avoid as much twisting, building up sitting tolerance, using pillows for better positioning in bed for back, sit<>stand stance to keep back more straight, postioning for dressing. Pt with difficulty with socks and shoes as well as getting to his lower legs and feet to wash them--issued pt a long handled sponge and a wide sock aid and talked him through how to use them as well as there are instructions in the wide sock aid bag.     Vision Patient Visual Report: No change from baseline              Pertinent Vitals/Pain Pain Assessment Pain Assessment: Faces Faces Pain Scale: Hurts little more Pain Location: Incision site Pain Descriptors / Indicators: Operative site guarding, Sore Pain Intervention(s): Monitored during session, Limited activity within patient's tolerance, Repositioned     Extremity/Trunk Assessment Upper Extremity Assessment Upper Extremity Assessment: Overall WFL for tasks assessed           Communication Communication Communication: No apparent difficulties   Cognition Arousal: Alert Behavior During Therapy: WFL for tasks assessed/performed Cognition: No apparent impairments  Following commands: Intact       Cueing   Cueing Techniques: Verbal cues              Home Living Family/patient expects to be discharged to:: Private residence Living Arrangements: Non-relatives/Friends Available Help at Discharge: Friend(s);Available PRN/intermittently Type of Home: House Home Access: Stairs to enter [employer] of Steps: 4 Entrance Stairs-Rails: Right;Left;Can reach both Home Layout: One level     Bathroom Shower/Tub: Chief Strategy Officer: Standard     Home Equipment: None          Prior Functioning/Environment Prior Level of Function : Independent/Modified Independent;Working/employed;Driving                    OT Problem List: Decreased range of motion;Pain         OT Goals(Current goals can be found in the care plan section)   Acute Rehab OT Goals Patient Stated Goal: to go home today         AM-PAC OT "6 Clicks" Daily Activity     Outcome Measure Help from another person eating meals?: None Help from another person taking care of personal grooming?: None Help from another person toileting, which includes using toliet, bedpan, or urinal?: None Help from another person bathing (including washing, rinsing, drying)?: A Little Help from another person to put on and taking off regular upper body clothing?: None Help from another person to put on and taking off regular lower body clothing?: A Little 6 Click Score: 22   End of Session Nurse Communication:  (pt ready to go from therapy standpoint)  Activity Tolerance: Patient tolerated treatment well Patient left:  (sitting EOB)  OT Visit Diagnosis: Other abnormalities of gait and mobility (R26.89);Pain Pain - part of body:  (incisional)                Time: 2841-3244 OT Time Calculation (min): 21 min Charges:  OT General Charges $OT Visit: 1 Visit OT Evaluation $OT Eval Moderate Complexity: 1 Mod  Cathy L. OT Acute Rehabilitation Services Office [phone]    Lenox Raider 01/25/2024, 11:03 AM

## 2024-01-25 NOTE — Discharge Summary (Signed)
 Physician Discharge Summary  Patient ID: Greg Mason MRN: [id number] DOB/AGE: 01/31/1971 53 y.o.  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 Czerwonka was admitted and taken to the operating room for an uncomplicated lumbar laminectomy for stenosis at L3/4, and a decompression and arthrodesis at L5/S1. Post op he is ambulating, voiding, and tolerating a regular diet. His wound is clean, dry, no signs of infection.   Treatments: surgery: POSTERIOR LUMBAR FUSION LUMBAR FIVE-SACRAL ONE Gill procedure L5/S1 Non segmental pedicle screw fixation (Nuvasive, mas plif) L5/S1,  LUMBAR THREE laminectomy  Discharge Exam: Blood pressure 134/74, pulse 67, temperature 98.4 F (36.9 C), temperature source Oral, resp. rate 18, height 6' (1.829 m), weight 89 kg, SpO2 99%. General appearance: alert, cooperative, appears stated age, and mild distress  Disposition: Discharge disposition: 01-Home or Self Care      Spondylolisthesis lumbosacral region  Allergies as of 01/25/2024   No Known Allergies      Medication List     STOP taking these medications    cyclobenzaprine 10 MG tablet Commonly known as: FLEXERIL   ibuprofen  800 MG tablet Commonly known as: ADVIL    traMADol 50 MG tablet Commonly known as: ULTRAM       TAKE these medications    diazepam  5 MG tablet Commonly known as: VALIUM  Take 1 tablet (5 mg total) by mouth every 6 (six) hours as needed for muscle spasms.   diphenhydrAMINE  25 mg capsule Commonly known as: BENADRYL  Take 25-50 mg by mouth daily as needed for itching or allergies.   oxyCODONE  5 MG immediate release tablet Commonly known as: Oxy IR/ROXICODONE  Take 1 tablet (5 mg total) by mouth every 6 (six) hours as needed for up to 8 days for moderate pain  (pain score 4-6). What changed:  how much to take reasons to take this   sildenafil  100 MG tablet Commonly known as: Viagra  Take 1 tablet (100 mg total) by mouth daily as needed for erectile dysfunction.   terbinafine  250 MG tablet Commonly known as: LAMISIL  Take 1 tablet (250 mg total) by mouth daily.        Follow-up Information     Audie Bleacher, MD Follow up.   Specialty: Neurosurgery Why: keep your scheduled appointment Contact information: [address] [place] [address] [phone]                 Signed: Audie Bleacher 01/25/2024, 9:27 AM

## 2024-01-25 NOTE — Progress Notes (Signed)
 Patient alert and oriented, mae's well, voiding adequate amount of urine, swallowing without difficulty, no c/o pain at time of discharge. Patient discharged home with family. Script and discharged instructions given to patient. Patient and family stated understanding of instructions given. Patient has an appointment with Dr. Franky Macho

## 2024-01-28 ENCOUNTER — Encounter (HOSPITAL_COMMUNITY): Payer: Self-pay | Admitting: Neurosurgery

## 2024-02-11 DIAGNOSIS — M4317 Spondylolisthesis, lumbosacral region: Secondary | ICD-10-CM | POA: Diagnosis not present

## 2024-02-11 DIAGNOSIS — Z6826 Body mass index (BMI) 26.0-26.9, adult: Secondary | ICD-10-CM | POA: Diagnosis not present

## 2024-02-15 IMAGING — DX DG LUMBAR SPINE COMPLETE 4+V
5 series · 5 of 5 positions shown · non-contrast
Comparison: None.

CLINICAL DATA: Lower back pain increasing over the last 2 weeks.

EXAM:
LUMBAR SPINE - COMPLETE 4+ VIEW

[l-spine ap]
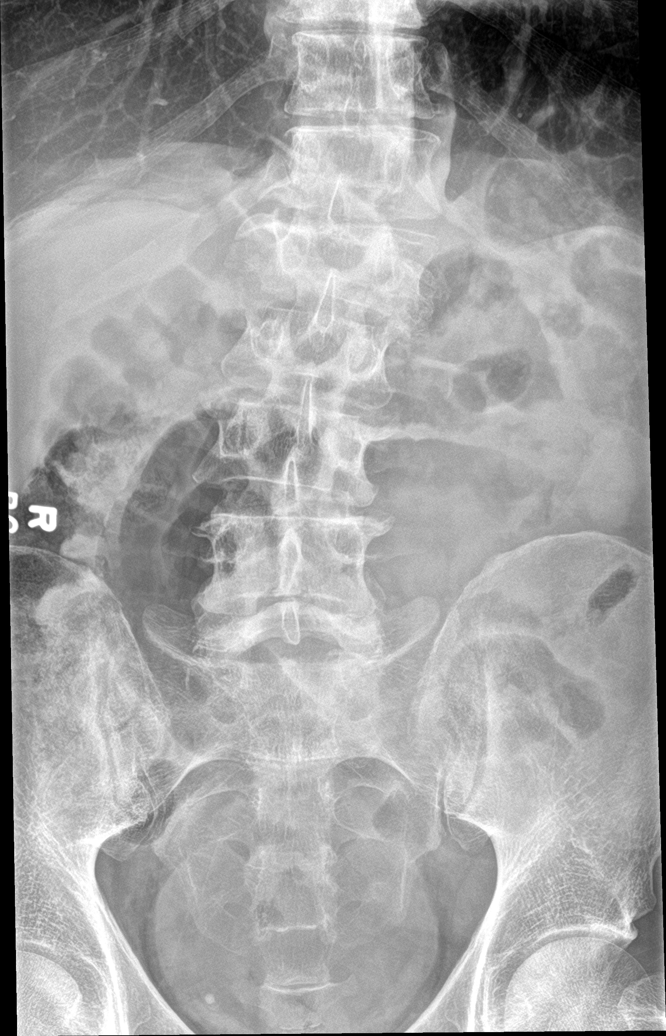

[l-spine obl (1 of 2)]
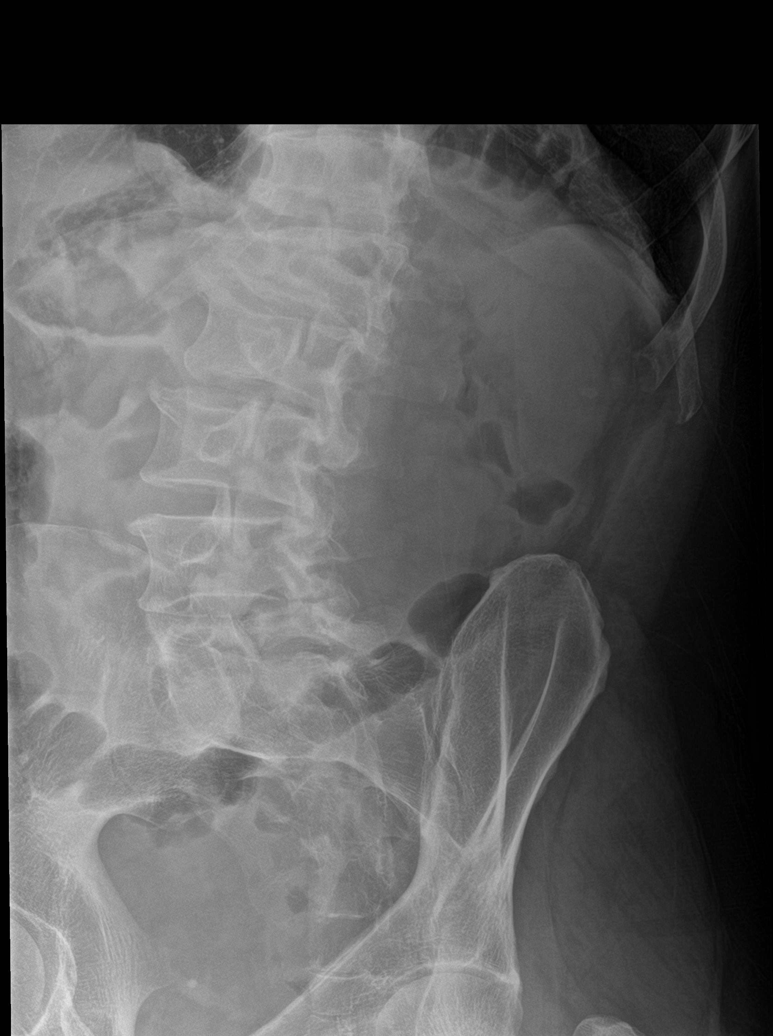

[l-spine obl (2 of 2)]
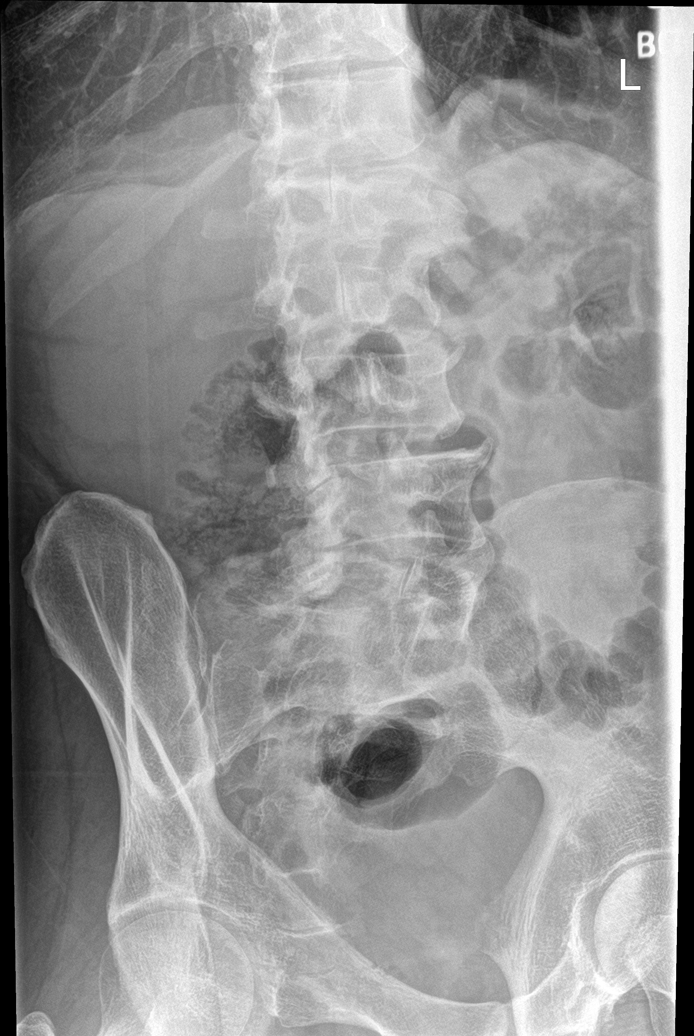

[l-spine lat]
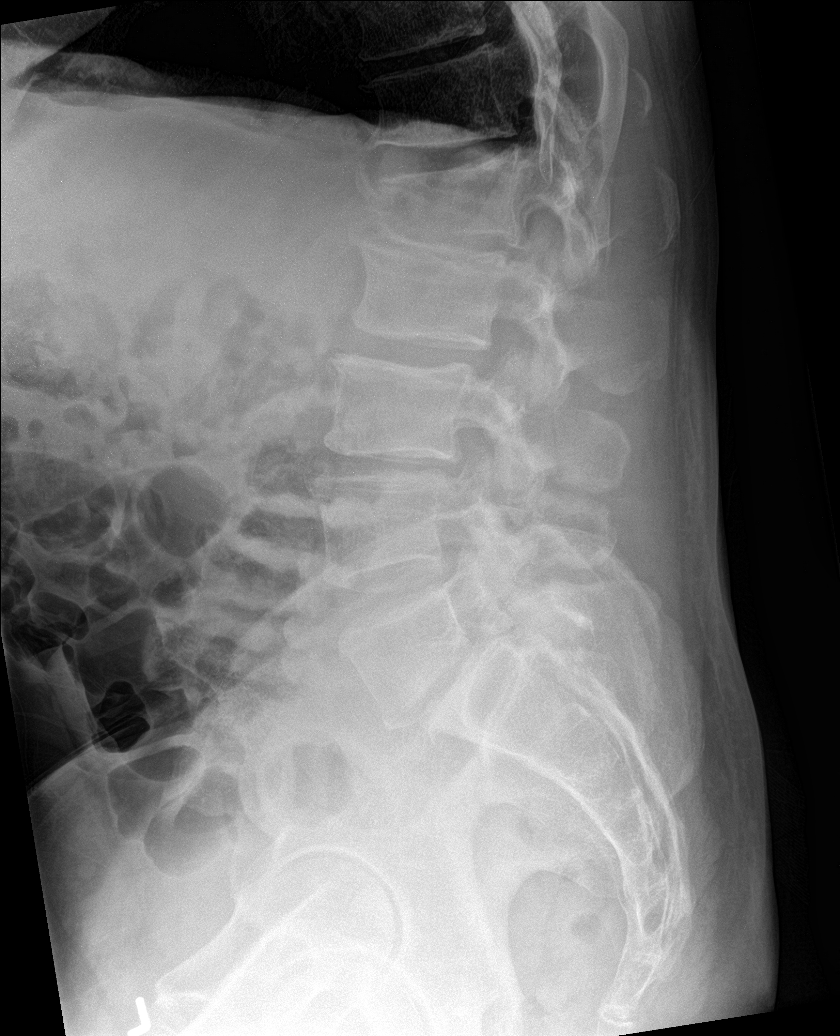

[l-spine spot]
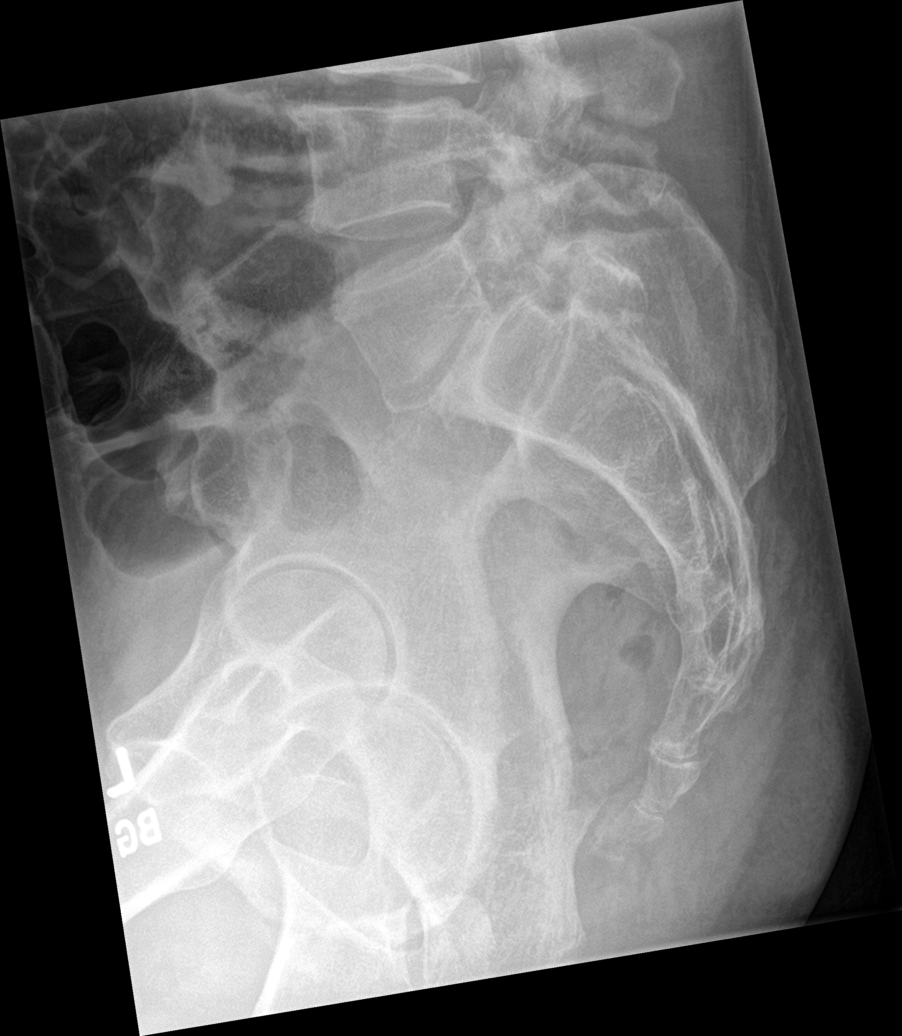

[5 of 5 positions shown; findings below may reference images not displayed]

FINDINGS: Dextroconvex curvature of the lumbar spine. Grade 1 retrolisthesis
of L2 on L3 and L3 on L4. Grade 1 anterolisthesis of L5 on S1. Mild
compression fracture of the L1 vertebral body. Mild anterior wedging
of the T12 vertebral body.
IMPRESSION: Mild anterior wedging of the T12 vertebral body and mild compression
deformity of the L1 vertebral body uncertain chronicity.

## 2024-03-20 DIAGNOSIS — M4317 Spondylolisthesis, lumbosacral region: Secondary | ICD-10-CM | POA: Diagnosis not present

## 2024-03-21 ENCOUNTER — Other Ambulatory Visit: Payer: Self-pay | Admitting: Neurosurgery

## 2024-03-21 DIAGNOSIS — M4317 Spondylolisthesis, lumbosacral region: Secondary | ICD-10-CM

## 2024-03-26 ENCOUNTER — Ambulatory Visit
Admission: RE | Admit: 2024-03-26 | Discharge: 2024-03-26 | Disposition: A | Discharge status: Home / Self Care | Source: Ambulatory Visit | Attending: Neurosurgery | Admitting: Neurosurgery

## 2024-03-26 DIAGNOSIS — M4317 Spondylolisthesis, lumbosacral region: Secondary | ICD-10-CM

## 2024-04-22 ENCOUNTER — Other Ambulatory Visit: Payer: Self-pay | Admitting: Neurosurgery

## 2024-06-20 NOTE — Pre-Procedure Instructions (Signed)
 Surgical Instructions   Your procedure is scheduled on June 25, 2024. Report to [facility] Main Entrance A at 6:30 A.M., then check in with the Admitting office. Any questions or running late day of surgery: call [phone]  Questions prior to your surgery date: call [phone], Monday-Friday, 8am-4pm. If you experience any cold or flu symptoms such as cough, fever, chills, shortness of breath, etc. between now and your scheduled surgery, please notify us  at the above number.     Remember:  Do not eat or drink after midnight the night before your surgery    Take these medicines the morning of surgery with A SIP OF WATER: diazepam  (VALIUM ) - may take if needed   One week prior to surgery, STOP taking any Aspirin (unless otherwise instructed by your surgeon) Aleve, Naproxen, Ibuprofen , Motrin , Advil , Goody's, BC's, all herbal medications, fish oil, and non-prescription vitamins.                     Do NOT Smoke (Tobacco/Vaping) for 24 hours prior to your procedure.  If you use a CPAP at night, you may bring your mask/headgear for your overnight stay.   You will be asked to remove any contacts, glasses, piercing's, hearing aid's, dentures/partials prior to surgery. Please bring cases for these items if needed.    Patients discharged the day of surgery will not be allowed to drive home, and someone needs to stay with them for 24 hours.  SURGICAL WAITING ROOM VISITATION Patients may have no more than 2 support people in the waiting area - these visitors may rotate.   Pre-op nurse will coordinate an appropriate time for 1 ADULT support person, who may not rotate, to accompany patient in pre-op.  Children under the age of 42 must have an adult with them who is not the patient and must remain in the main waiting area with an adult.  If the patient needs to stay at the hospital during part of their recovery, the visitor guidelines for inpatient rooms apply.  Please refer to the  [facility] website for the visitor guidelines for any additional information.   If you received a COVID test during your pre-op visit  it is requested that you wear a mask when out in public, stay away from anyone that may not be feeling well and notify your surgeon if you develop symptoms. If you have been in contact with anyone that has tested positive in the last 10 days please notify you surgeon.      Pre-operative 5 CHG Bathing Instructions   You can play a key role in reducing the risk of infection after surgery. Your skin needs to be as free of germs as possible. You can reduce the number of germs on your skin by washing with CHG (chlorhexidine  gluconate) soap before surgery. CHG is an antiseptic soap that kills germs and continues to kill germs even after washing.   DO NOT use if you have an allergy to chlorhexidine /CHG or antibacterial soaps. If your skin becomes reddened or irritated, stop using the CHG and notify one of our RNs at [phone].   Please shower with the CHG soap starting 4 days before surgery using the following schedule:     Please keep in mind the following:  DO NOT shave, including legs and underarms, starting the day of your first shower.   You may shave your face at any point before/day of surgery.  Place clean sheets on your bed the day  you start using CHG soap. Use a clean washcloth (not used since being washed) for each shower. DO NOT sleep with pets once you start using the CHG.   CHG Shower Instructions:  Wash your face and private area with normal soap. If you choose to wash your hair, wash first with your normal shampoo.  After you use shampoo/soap, rinse your hair and body thoroughly to remove shampoo/soap residue.  Turn the water OFF and apply about 3 tablespoons (45 ml) of CHG soap to a CLEAN washcloth.  Apply CHG soap ONLY FROM YOUR NECK DOWN TO YOUR TOES (washing for 3-5 minutes)  DO NOT use CHG soap on face, private areas, open wounds, or  sores.  Pay special attention to the area where your surgery is being performed.  If you are having back surgery, having someone wash your back for you may be helpful. Wait 2 minutes after CHG soap is applied, then you may rinse off the CHG soap.  Pat dry with a clean towel  Put on clean clothes/pajamas   If you choose to wear lotion, please use ONLY the CHG-compatible lotions that are listed below.  Additional instructions for the day of surgery: DO NOT APPLY any lotions, deodorants, cologne, or perfumes.   Do not bring valuables to the hospital. [facility] is not responsible for any belongings/valuables. Do not wear nail polish, gel polish, artificial nails, or any other type of covering on natural nails (fingers and toes) Do not wear jewelry or makeup Put on clean/comfortable clothes.  Please brush your teeth.  Ask your nurse before applying any prescription medications to the skin.     CHG Compatible Lotions   Aveeno Moisturizing lotion  Cetaphil Moisturizing Cream  Cetaphil Moisturizing Lotion  Clairol Herbal Essence Moisturizing Lotion, Dry Skin  Clairol Herbal Essence Moisturizing Lotion, Extra Dry Skin  Clairol Herbal Essence Moisturizing Lotion, Normal Skin  Curel Age Defying Therapeutic Moisturizing Lotion with Alpha Hydroxy  Curel Extreme Care Body Lotion  Curel Soothing Hands Moisturizing Hand Lotion  Curel Therapeutic Moisturizing Cream, Fragrance-Free  Curel Therapeutic Moisturizing Lotion, Fragrance-Free  Curel Therapeutic Moisturizing Lotion, Original Formula  Eucerin Daily Replenishing Lotion  Eucerin Dry Skin Therapy Plus Alpha Hydroxy Crme  Eucerin Dry Skin Therapy Plus Alpha Hydroxy Lotion  Eucerin Original Crme  Eucerin Original Lotion  Eucerin Plus Crme Eucerin Plus Lotion  Eucerin TriLipid Replenishing Lotion  Keri Anti-Bacterial Hand Lotion  Keri Deep Conditioning Original Lotion Dry Skin Formula Softly Scented  Keri Deep Conditioning Original  Lotion, Fragrance Free Sensitive Skin Formula  Keri Lotion Fast Absorbing Fragrance Free Sensitive Skin Formula  Keri Lotion Fast Absorbing Softly Scented Dry Skin Formula  Keri Original Lotion  Keri Skin Renewal Lotion Keri Silky Smooth Lotion  Keri Silky Smooth Sensitive Skin Lotion  Nivea Body Creamy Conditioning Oil  Nivea Body Extra Enriched Lotion  Nivea Body Original Lotion  Nivea Body Sheer Moisturizing Lotion Nivea Crme  Nivea Skin Firming Lotion  NutraDerm 30 Skin Lotion  NutraDerm Skin Lotion  NutraDerm Therapeutic Skin Cream  NutraDerm Therapeutic Skin Lotion  ProShield Protective Hand Cream  Provon moisturizing lotion  Please read over the following fact sheets that you were given.

## 2024-06-23 ENCOUNTER — Other Ambulatory Visit: Payer: Self-pay

## 2024-06-23 ENCOUNTER — Encounter (HOSPITAL_COMMUNITY): Payer: Self-pay

## 2024-06-23 ENCOUNTER — Encounter (HOSPITAL_COMMUNITY)
Admission: RE | Admit: 2024-06-23 | Discharge: 2024-06-23 | Disposition: A | Discharge status: Home / Self Care | Source: Ambulatory Visit | Attending: Neurosurgery | Admitting: Neurosurgery

## 2024-06-23 VITALS — BP 136/70 | HR 65 | Temp 98.0°F | Resp 18 | Ht 72.0 in | Wt 192.2 lb

## 2024-06-23 DIAGNOSIS — Z01818 Encounter for other preprocedural examination: Secondary | ICD-10-CM

## 2024-06-23 DIAGNOSIS — Z01812 Encounter for preprocedural laboratory examination: Secondary | ICD-10-CM | POA: Diagnosis not present

## 2024-06-23 LAB — CBC
HCT: 42 % (ref 39.0–52.0)
Hemoglobin: 14.1 g/dL (ref 13.0–17.0)
MCH: 29.9 pg (ref 26.0–34.0)
MCHC: 33.6 g/dL (ref 30.0–36.0)
MCV: 89 fL (ref 80.0–100.0)
Platelets: 293 K/uL (ref 150–400)
RBC: 4.72 MIL/uL (ref 4.22–5.81)
RDW: 12.5 % (ref 11.5–15.5)
WBC: 8.5 K/uL (ref 4.0–10.5)
nRBC: 0 % (ref 0.0–0.2)

## 2024-06-23 LAB — SURGICAL PCR SCREEN
MRSA, PCR: NEGATIVE
Staphylococcus aureus: NEGATIVE

## 2024-06-23 NOTE — Progress Notes (Signed)
 PCP - NO PCP Cardiologist -   PPM/ICD - denies Device Orders - n/a Rep Notified - n/a  Chest x-ray - denies EKG - denies Stress Test - denies ECHO - denies Cardiac Cath - denies  Sleep Study - denies CPAP - n/a  Dm -denies  Blood Thinner Instructions:denies Aspirin Instructions:denies  ERAS Protcol -NPO   COVID TEST- n/a   Anesthesia review: NO. Patient denies any chest pain or recent episodes of chest pain  Patient denies shortness of breath, fever, cough and chest pain at PAT appointment   All instructions explained to the patient, with a verbal understanding of the material. Patient agrees to go over the instructions while at home for a better understanding. Patient also instructed to self quarantine after being tested for COVID-19. The opportunity to ask questions was provided.

## 2024-06-25 ENCOUNTER — Encounter (HOSPITAL_COMMUNITY): Payer: Self-pay | Admitting: Neurosurgery

## 2024-06-25 ENCOUNTER — Ambulatory Visit (HOSPITAL_COMMUNITY)
Admission: RE | Admit: 2024-06-25 | Discharge: 2024-06-25 | Disposition: A | Discharge status: Home / Self Care | Attending: Neurosurgery | Admitting: Neurosurgery

## 2024-06-25 ENCOUNTER — Encounter (HOSPITAL_COMMUNITY): Admission: RE | Disposition: A | Payer: Self-pay | Source: Home / Self Care | Attending: Neurosurgery

## 2024-06-25 ENCOUNTER — Other Ambulatory Visit: Payer: Self-pay

## 2024-06-25 ENCOUNTER — Ambulatory Visit (HOSPITAL_COMMUNITY)

## 2024-06-25 ENCOUNTER — Other Ambulatory Visit (HOSPITAL_COMMUNITY): Payer: Self-pay

## 2024-06-25 DIAGNOSIS — T8484XA Pain due to internal orthopedic prosthetic devices, implants and grafts, initial encounter: Secondary | ICD-10-CM | POA: Diagnosis not present

## 2024-06-25 DIAGNOSIS — M4317 Spondylolisthesis, lumbosacral region: Secondary | ICD-10-CM

## 2024-06-25 DIAGNOSIS — Z981 Arthrodesis status: Secondary | ICD-10-CM | POA: Diagnosis not present

## 2024-06-25 DIAGNOSIS — Y838 Other surgical procedures as the cause of abnormal reaction of the patient, or of later complication, without mention of misadventure at the time of the procedure: Secondary | ICD-10-CM | POA: Diagnosis not present

## 2024-06-25 SURGERY — REVISION, SPINAL HARDWARE
Anesthesia: General

## 2024-06-25 MED ORDER — EPHEDRINE 5 MG/ML INJ
INTRAVENOUS | Status: AC
  Filled 2024-06-25: qty 5

## 2024-06-25 MED ORDER — ACETAMINOPHEN 650 MG RE SUPP: 650.0000 mg | RECTAL | Status: DC | PRN

## 2024-06-25 MED ORDER — CEFAZOLIN SODIUM-DEXTROSE 2-4 GM/100ML-% IV SOLN
2.0000 g | INTRAVENOUS | Status: AC
  Administered 2024-06-25: 2 g via INTRAVENOUS
  Filled 2024-06-25: qty 100

## 2024-06-25 MED ORDER — ORAL CARE MOUTH RINSE: 15.0000 mL | Freq: Once | OROMUCOSAL | Status: AC

## 2024-06-25 MED ORDER — ACETAMINOPHEN 10 MG/ML IV SOLN
1000.0000 mg | Freq: Once | INTRAVENOUS | Status: DC | PRN
  Administered 2024-06-25: 1000 mg via INTRAVENOUS

## 2024-06-25 MED ORDER — AMISULPRIDE (ANTIEMETIC) 5 MG/2ML IV SOLN: 10.0000 mg | Freq: Once | INTRAVENOUS | Status: DC | PRN

## 2024-06-25 MED ORDER — OXYCODONE HCL 5 MG PO TABS: 5.0000 mg | ORAL_TABLET | ORAL | Status: DC | PRN

## 2024-06-25 MED ORDER — LACTATED RINGERS IV SOLN: INTRAVENOUS | Status: DC

## 2024-06-25 MED ORDER — ONDANSETRON HCL 4 MG/2ML IJ SOLN
INTRAMUSCULAR | Status: AC
  Filled 2024-06-25: qty 2

## 2024-06-25 MED ORDER — LIDOCAINE 2% (20 MG/ML) 5 ML SYRINGE
INTRAMUSCULAR | Status: AC
  Filled 2024-06-25: qty 5

## 2024-06-25 MED ORDER — OXYCODONE HCL 5 MG PO TABS
5.0000 mg | ORAL_TABLET | ORAL | 0 refills | Status: DC | PRN
  Filled 2024-06-25: qty 30, 4d supply, fill #0

## 2024-06-25 MED ORDER — TIZANIDINE HCL 4 MG PO TABS
4.0000 mg | ORAL_TABLET | Freq: Four times a day (QID) | ORAL | 0 refills | Status: DC | PRN
  Filled 2024-06-25: qty 60, 15d supply, fill #0

## 2024-06-25 MED ORDER — FENTANYL CITRATE (PF) 100 MCG/2ML IJ SOLN
INTRAMUSCULAR | Status: AC
  Filled 2024-06-25: qty 2

## 2024-06-25 MED ORDER — PROPOFOL 10 MG/ML IV BOLUS
INTRAVENOUS | Status: AC
Start: 2024-06-25 — End: 2024-06-25
  Filled 2024-06-25: qty 20

## 2024-06-25 MED ORDER — MORPHINE SULFATE (PF) 2 MG/ML IV SOLN: 2.0000 mg | INTRAVENOUS | Status: DC | PRN

## 2024-06-25 MED ORDER — OXYCODONE HCL 5 MG PO TABS: 5.0000 mg | ORAL_TABLET | Freq: Once | ORAL | Status: DC | PRN

## 2024-06-25 MED ORDER — SODIUM CHLORIDE 0.9% FLUSH: 3.0000 mL | Freq: Two times a day (BID) | INTRAVENOUS | Status: DC

## 2024-06-25 MED ORDER — OXYCODONE HCL 5 MG PO TABS
10.0000 mg | ORAL_TABLET | ORAL | Status: DC | PRN
  Administered 2024-06-25: 10 mg via ORAL
  Filled 2024-06-25: qty 2

## 2024-06-25 MED ORDER — LIDOCAINE-EPINEPHRINE 0.5 %-1:200000 IJ SOLN
INTRAMUSCULAR | Status: AC
Start: 2024-06-25 — End: 2024-06-25
  Filled 2024-06-25: qty 50

## 2024-06-25 MED ORDER — PROPOFOL 10 MG/ML IV BOLUS
INTRAVENOUS | Status: DC | PRN
Start: 2024-06-25 — End: 2024-06-25
  Administered 2024-06-25: 150 mg via INTRAVENOUS

## 2024-06-25 MED ORDER — SODIUM CHLORIDE 0.9% FLUSH: 3.0000 mL | INTRAVENOUS | Status: DC | PRN

## 2024-06-25 MED ORDER — SODIUM CHLORIDE 0.9 % IV SOLN
250.0000 mL | INTRAVENOUS | Status: DC
Start: 2024-06-25 — End: 2024-06-25

## 2024-06-25 MED ORDER — HYDROMORPHONE HCL 1 MG/ML IJ SOLN
INTRAMUSCULAR | Status: DC | PRN
  Administered 2024-06-25: .5 mg via INTRAVENOUS

## 2024-06-25 MED ORDER — PHENYLEPHRINE HCL-NACL 20-0.9 MG/250ML-% IV SOLN
INTRAVENOUS | Status: DC | PRN
  Administered 2024-06-25: 25 ug/min via INTRAVENOUS

## 2024-06-25 MED ORDER — PHENYLEPHRINE 80 MCG/ML (10ML) SYRINGE FOR IV PUSH (FOR BLOOD PRESSURE SUPPORT)
PREFILLED_SYRINGE | INTRAVENOUS | Status: AC
  Filled 2024-06-25: qty 10

## 2024-06-25 MED ORDER — OXYCODONE HCL 5 MG/5ML PO SOLN: 5.0000 mg | Freq: Once | ORAL | Status: DC | PRN

## 2024-06-25 MED ORDER — MIDAZOLAM HCL 2 MG/2ML IJ SOLN
INTRAMUSCULAR | Status: AC
  Filled 2024-06-25: qty 2

## 2024-06-25 MED ORDER — LIDOCAINE 2% (20 MG/ML) 5 ML SYRINGE
INTRAMUSCULAR | Status: DC | PRN
  Administered 2024-06-25: 100 mg via INTRAVENOUS

## 2024-06-25 MED ORDER — 0.9 % SODIUM CHLORIDE (POUR BTL) OPTIME
TOPICAL | Status: DC | PRN
  Administered 2024-06-25: 1000 mL

## 2024-06-25 MED ORDER — ACETAMINOPHEN 500 MG PO TABS: 1000.0000 mg | ORAL_TABLET | Freq: Four times a day (QID) | ORAL | Status: DC

## 2024-06-25 MED ORDER — THROMBIN 20000 UNITS EX SOLR
CUTANEOUS | Status: DC | PRN
  Administered 2024-06-25: 20 mL via TOPICAL

## 2024-06-25 MED ORDER — ACETAMINOPHEN 10 MG/ML IV SOLN
INTRAVENOUS | Status: AC
Start: 2024-06-25 — End: 2024-06-25
  Filled 2024-06-25: qty 100

## 2024-06-25 MED ORDER — ROCURONIUM BROMIDE 10 MG/ML (PF) SYRINGE
PREFILLED_SYRINGE | INTRAVENOUS | Status: DC | PRN
  Administered 2024-06-25: 80 mg via INTRAVENOUS

## 2024-06-25 MED ORDER — EPHEDRINE SULFATE-NACL 50-0.9 MG/10ML-% IV SOSY
PREFILLED_SYRINGE | INTRAVENOUS | Status: DC | PRN
Start: 2024-06-25 — End: 2024-06-25
  Administered 2024-06-25 (×2): 5 mg via INTRAVENOUS

## 2024-06-25 MED ORDER — ONDANSETRON HCL 4 MG/2ML IJ SOLN: 4.0000 mg | Freq: Four times a day (QID) | INTRAMUSCULAR | Status: DC | PRN

## 2024-06-25 MED ORDER — THROMBIN 20000 UNITS EX SOLR
CUTANEOUS | Status: AC
  Filled 2024-06-25: qty 20000

## 2024-06-25 MED ORDER — OXYCODONE HCL ER 15 MG PO T12A
15.0000 mg | EXTENDED_RELEASE_TABLET | Freq: Two times a day (BID) | ORAL | Status: DC
  Administered 2024-06-25: 15 mg via ORAL
  Filled 2024-06-25: qty 1

## 2024-06-25 MED ORDER — BUPIVACAINE HCL (PF) 0.25 % IJ SOLN
INTRAMUSCULAR | Status: DC | PRN
  Administered 2024-06-25 (×2): 30 mL

## 2024-06-25 MED ORDER — CHLORHEXIDINE GLUCONATE CLOTH 2 % EX PADS: 6.0000 | MEDICATED_PAD | Freq: Once | CUTANEOUS | Status: DC

## 2024-06-25 MED ORDER — DEXAMETHASONE SODIUM PHOSPHATE 10 MG/ML IJ SOLN
INTRAMUSCULAR | Status: AC
  Filled 2024-06-25: qty 1

## 2024-06-25 MED ORDER — DIAZEPAM 5 MG PO TABS: 5.0000 mg | ORAL_TABLET | Freq: Four times a day (QID) | ORAL | Status: DC | PRN

## 2024-06-25 MED ORDER — PHENOL 1.4 % MT LIQD: 1.0000 | OROMUCOSAL | Status: DC | PRN

## 2024-06-25 MED ORDER — POTASSIUM CHLORIDE IN NACL 20-0.9 MEQ/L-% IV SOLN
INTRAVENOUS | Status: DC
  Filled 2024-06-25: qty 1000

## 2024-06-25 MED ORDER — ACETAMINOPHEN 325 MG PO TABS: 650.0000 mg | ORAL_TABLET | ORAL | Status: DC | PRN

## 2024-06-25 MED ORDER — MIDAZOLAM HCL 2 MG/2ML IJ SOLN
INTRAMUSCULAR | Status: DC | PRN
Start: 2024-06-25 — End: 2024-06-25
  Administered 2024-06-25: 2 mg via INTRAVENOUS

## 2024-06-25 MED ORDER — MENTHOL 3 MG MT LOZG: 1.0000 | LOZENGE | OROMUCOSAL | Status: DC | PRN

## 2024-06-25 MED ORDER — DEXAMETHASONE SODIUM PHOSPHATE 10 MG/ML IJ SOLN
INTRAMUSCULAR | Status: DC | PRN
Start: 2024-06-25 — End: 2024-06-25
  Administered 2024-06-25: 10 mg via INTRAVENOUS

## 2024-06-25 MED ORDER — ZOLPIDEM TARTRATE 5 MG PO TABS: 5.0000 mg | ORAL_TABLET | Freq: Every evening | ORAL | Status: DC | PRN

## 2024-06-25 MED ORDER — FENTANYL CITRATE (PF) 250 MCG/5ML IJ SOLN
INTRAMUSCULAR | Status: DC | PRN
  Administered 2024-06-25 (×2): 100 ug via INTRAVENOUS
  Administered 2024-06-25: 50 ug via INTRAVENOUS

## 2024-06-25 MED ORDER — CHLORHEXIDINE GLUCONATE 0.12 % MT SOLN
15.0000 mL | Freq: Once | OROMUCOSAL | Status: AC
  Administered 2024-06-25: 15 mL via OROMUCOSAL
  Filled 2024-06-25: qty 15

## 2024-06-25 MED ORDER — ONDANSETRON HCL 4 MG/2ML IJ SOLN
INTRAMUSCULAR | Status: DC | PRN
  Administered 2024-06-25: 4 mg via INTRAVENOUS

## 2024-06-25 MED ORDER — FENTANYL CITRATE (PF) 250 MCG/5ML IJ SOLN
INTRAMUSCULAR | Status: AC
  Filled 2024-06-25: qty 5

## 2024-06-25 MED ORDER — ROCURONIUM BROMIDE 10 MG/ML (PF) SYRINGE
PREFILLED_SYRINGE | INTRAVENOUS | Status: AC
  Filled 2024-06-25: qty 10

## 2024-06-25 MED ORDER — PHENYLEPHRINE 80 MCG/ML (10ML) SYRINGE FOR IV PUSH (FOR BLOOD PRESSURE SUPPORT)
PREFILLED_SYRINGE | INTRAVENOUS | Status: DC | PRN
Start: 2024-06-25 — End: 2024-06-25
  Administered 2024-06-25 (×2): 40 ug via INTRAVENOUS

## 2024-06-25 MED ORDER — FENTANYL CITRATE (PF) 100 MCG/2ML IJ SOLN
25.0000 ug | INTRAMUSCULAR | Status: DC | PRN
  Administered 2024-06-25 (×3): 50 ug via INTRAVENOUS

## 2024-06-25 MED ORDER — HYDROMORPHONE HCL 1 MG/ML IJ SOLN
INTRAMUSCULAR | Status: AC
  Filled 2024-06-25: qty 0.5

## 2024-06-25 MED ORDER — ONDANSETRON HCL 4 MG PO TABS: 4.0000 mg | ORAL_TABLET | Freq: Four times a day (QID) | ORAL | Status: DC | PRN

## 2024-06-25 MED ORDER — BUPIVACAINE HCL (PF) 0.25 % IJ SOLN
INTRAMUSCULAR | Status: AC
  Filled 2024-06-25: qty 30

## 2024-06-25 MED ORDER — SUGAMMADEX SODIUM 200 MG/2ML IV SOLN
INTRAVENOUS | Status: DC | PRN
  Administered 2024-06-25: 200 mg via INTRAVENOUS

## 2024-06-25 MED ORDER — ATROPINE SULFATE 0.4 MG/ML IV SOLN
INTRAVENOUS | Status: DC | PRN
Start: 2024-06-25 — End: 2024-06-25
  Administered 2024-06-25: .4 mg via INTRAVENOUS

## 2024-06-25 SURGICAL SUPPLY — 51 items
BAG COUNTER SPONGE SURGICOUNT (BAG) ×1 IMPLANT
BENZOIN TINCTURE PRP APPL 2/3 (GAUZE/BANDAGES/DRESSINGS) IMPLANT
BLADE CLIPPER SURG (BLADE) IMPLANT
BUR MATCHSTICK NEURO 3.0 LAGG (BURR) ×1 IMPLANT
BUR PRECISION FLUTE 5.0 (BURR) IMPLANT
CANISTER SUCTION 3000ML PPV (SUCTIONS) ×1 IMPLANT
CAP RELINE MOD TULIP RMM (Cap) IMPLANT
DERMABOND ADVANCED .7 DNX12 (GAUZE/BANDAGES/DRESSINGS) ×1 IMPLANT
DRAPE C-ARM 42X72 X-RAY (DRAPES) ×2 IMPLANT
DRAPE LAPAROTOMY 100X72X124 (DRAPES) ×1 IMPLANT
DRAPE MICROSCOPE SLANT 54X150 (MISCELLANEOUS) ×1 IMPLANT
DRAPE SURG 17X23 STRL (DRAPES) ×1 IMPLANT
DRSG OPSITE POSTOP 4X6 (GAUZE/BANDAGES/DRESSINGS) IMPLANT
DURAPREP 26ML APPLICATOR (WOUND CARE) ×1 IMPLANT
ELECTRODE REM PT RTRN 9FT ADLT (ELECTROSURGICAL) ×1 IMPLANT
GAUZE 4X4 16PLY ~~LOC~~+RFID DBL (SPONGE) IMPLANT
GAUZE SPONGE 4X4 12PLY STRL (GAUZE/BANDAGES/DRESSINGS) IMPLANT
GLOVE BIOGEL PI IND STRL 7.5 (GLOVE) IMPLANT
GLOVE ECLIPSE 6.5 STRL STRAW (GLOVE) ×1 IMPLANT
GLOVE EXAM NITRILE XL STR (GLOVE) IMPLANT
GLOVE SURG SS PI 7.0 STRL IVOR (GLOVE) IMPLANT
GOWN STRL REUS W/ TWL LRG LVL3 (GOWN DISPOSABLE) ×2 IMPLANT
GOWN STRL REUS W/ TWL XL LVL3 (GOWN DISPOSABLE) IMPLANT
GOWN STRL REUS W/TWL 2XL LVL3 (GOWN DISPOSABLE) IMPLANT
KIT BASIN OR (CUSTOM PROCEDURE TRAY) ×1 IMPLANT
KIT INFUSE MEDIUM (Orthopedic Implant) IMPLANT
KIT TURNOVER KIT B (KITS) ×1 IMPLANT
NDL HYPO 25X1 1.5 SAFETY (NEEDLE) ×1 IMPLANT
NDL SPNL 18GX3.5 QUINCKE PK (NEEDLE) IMPLANT
NEEDLE HYPO 25X1 1.5 SAFETY (NEEDLE) ×1 IMPLANT
NEEDLE SPNL 18GX3.5 QUINCKE PK (NEEDLE) IMPLANT
PACK LAMINECTOMY NEURO (CUSTOM PROCEDURE TRAY) ×1 IMPLANT
PAD ARMBOARD POSITIONER FOAM (MISCELLANEOUS) ×3 IMPLANT
ROD RELINE COCR LORD 5.0X35 (Rod) IMPLANT
ROD RELINE COCR LORD 5X30 (Rod) IMPLANT
SCREW LOCK RSS 4.5/5.0MM (Screw) IMPLANT
SCREW SHANK RELINE MOD 7.5X40 (Screw) IMPLANT
SHANK RELINE O MOD 6.5X45 (Screw) IMPLANT
SOLN 0.9% NACL 1000 ML (IV SOLUTION) ×1 IMPLANT
SOLN 0.9% NACL POUR BTL 1000ML (IV SOLUTION) ×1 IMPLANT
SOLN STERILE WATER 1000 ML (IV SOLUTION) ×1 IMPLANT
SOLN STERILE WATER BTL 1000 ML (IV SOLUTION) ×1 IMPLANT
SPIKE FLUID TRANSFER (MISCELLANEOUS) ×1 IMPLANT
SPONGE SURGIFOAM ABS GEL SZ50 (HEMOSTASIS) ×1 IMPLANT
SPONGE T-LAP 4X18 ~~LOC~~+RFID (SPONGE) IMPLANT
STRIP CLOSURE SKIN 1/2X4 (GAUZE/BANDAGES/DRESSINGS) IMPLANT
SUT VIC AB 0 CT1 18XCR BRD8 (SUTURE) ×1 IMPLANT
SUT VIC AB 2-0 CT1 18 (SUTURE) ×1 IMPLANT
SUT VIC AB 3-0 SH 8-18 (SUTURE) ×1 IMPLANT
TOWEL GREEN STERILE (TOWEL DISPOSABLE) ×1 IMPLANT
TOWEL GREEN STERILE FF (TOWEL DISPOSABLE) ×1 IMPLANT

## 2024-06-25 NOTE — Anesthesia Preprocedure Evaluation (Addendum)
 Anesthesia Evaluation  Patient identified by MRN, date of birth, ID band Patient awake    Reviewed: Allergy & Precautions, NPO status , Patient's Chart, lab work & pertinent test results  Airway Mallampati: I  TM Distance: >3 FB Neck ROM: Full    Dental no notable dental hx.    Pulmonary neg pulmonary ROS   Pulmonary exam normal        Cardiovascular negative cardio ROS Normal cardiovascular exam     Neuro/Psych  Headaches  negative psych ROS   GI/Hepatic negative GI ROS, Neg liver ROS,,,  Endo/Other  negative endocrine ROS    Renal/GU negative Renal ROS     Musculoskeletal  (+) Arthritis ,    Abdominal   Peds  Hematology negative hematology ROS (+)   Anesthesia Other Findings Spondylolisthesis, lumbosacral region  Reproductive/Obstetrics                              Anesthesia Physical Anesthesia Plan  ASA: 2  Anesthesia Plan: General   Post-op Pain Management:    Induction: Intravenous  PONV Risk Score and Plan: 2 and Ondansetron , Dexamethasone , Midazolam  and Treatment may vary due to age or medical condition  Airway Management Planned: Oral ETT  Additional Equipment:   Intra-op Plan:   Post-operative Plan: Extubation in OR  Informed Consent: I have reviewed the patients History and Physical, chart, labs and discussed the procedure including the risks, benefits and alternatives for the proposed anesthesia with the patient or authorized representative who has indicated his/her understanding and acceptance.     Dental advisory given  Plan Discussed with: CRNA  Anesthesia Plan Comments:          Anesthesia Quick Evaluation

## 2024-06-25 NOTE — Op Note (Signed)
 06/25/2024  6:46 PM  PATIENT:  Greg Mason  53 y.o. male Has painful orthopedic hardware. The right L5 screw has a halo around it. The left L5 screw will be removed so that the neural foramen can be explored and decompressed if needed.  PRE-OPERATIVE DIAGNOSIS:  Spondylolisthesis, lumbosacral region  POST-OPERATIVE DIAGNOSIS:  Spondylolisthesis, lumbosacral region  PROCEDURE:  Procedure(s): REVISION, SPINAL HARDWARE LUMBAR FIVE SCREW  SURGEON: Surgeon(s): Gillie Duncans, MD  ASSISTANTS:none  ANESTHESIA:   general  EBL:  Total I/O In: 1740 [P.O.:240; I.V.:1400; IV Piggyback:100] Out: 50 [Blood:50]  BLOOD ADMINISTERED:none  CELL SAVER GIVEN:not used  COUNT:per nursing  DRAINS: none   SPECIMEN:  No Specimen  DICTATION: [name] was taken to the operating room, intubated, and placed under a general anesthetic without difficulty. She was positioned prone on the [place] table with all pressure points properly padded. His lumbar region was prepped and draped in a sterile manner.  I opened the previous incision and exposed the lamina of L4 and the sacrum. I then was able to expose the pedicle screws on both sides at L5 and S1. I removed the locking caps on the screws, the rods, then the L5 screws.  On the left side I exposed the foramen and decompressed the left L5 root with Kerrison punches. Once satisfied I placed a new screw lateral to the entry point of the previous screw on the left side into the L5 pedicle. I used fluoroscopy to aid in placing the screw.  On the right side I removed the screw at L5 and replaced it with a 6.32mm screw, same length(28mm). I placed new rods and locking caps on each side and secured the construct with a Torque limited screwdriver.  I placed BMP on the right side for the posterolateral arthrodesis.  I irrigated, checked fluoroscopic images which did show the hardware in correct position.  I closed the incision with vicryl sutures. I used  dermabond and an occlusive bandage for a sterile dressing.  He was flipped onto a stretcher, extubated. He was moving all extremities.  PLAN OF CARE: Admit for overnight observation  PATIENT DISPOSITION:  PACU - hemodynamically stable.   Delay start of Pharmacological VTE agent (>24hrs) due to surgical blood loss or risk of bleeding:  yes

## 2024-06-25 NOTE — Evaluation (Signed)
 Occupational Therapy Evaluation & Discharge Patient Details Name: Greg Mason MRN: [id number] DOB: 08-19-1971 Today's Date: 06/25/2024   History of Present Illness   Pt is a 53 yo male s/p removal of L5 screw 10/01     Clinical Impressions Pt admitted based on above, and was seen based on problem list below. PTA pt was independent with ADLs and IADLs. Today pt is supervision to mod I with ADLs with use of compensatory strategies for ADLs. Provided and reviewed back educational handout with pt; pt able to verbalize and demo return understanding. Pt mobilizing well ~395ft no AD or LOB. Negotiated 4 steps with step through pattern at supervision to Mod I. No follow up OT or DME needs identified. All education complete, no further acute OT needs, OT is signing off on this pt.         If plan is discharge home, recommend the following:   Assistance with cooking/housework     Functional Status Assessment   Patient has had a recent decline in their functional status and demonstrates the ability to make significant improvements in function in a reasonable and predictable amount of time.     Equipment Recommendations   None recommended by OT      Precautions/Restrictions   Precautions Precautions: Back Precaution Booklet Issued: Yes (comment) Recall of Precautions/Restrictions: Intact Precaution/Restrictions Comments: No Brace Restrictions Weight Bearing Restrictions Per Provider Order: No     Mobility Bed Mobility Overal bed mobility: Modified Independent             General bed mobility comments: HOB elevated    Transfers Overall transfer level: Modified independent Equipment used: None     General transfer comment: Pt ambulating ~322ft, negotiated 4 steps to enter home with step through pattern, mod I to supervision level      Balance Overall balance assessment: Independent     ADL either performed or assessed with clinical judgement   ADL  Overall ADL's : Modified independent       General ADL Comments: Pt completing LB dressing, standing grooming tasks and functional transfers mod I with use of compensatory strategies     Vision Patient Visual Report: No change from baseline Vision Assessment?: No apparent visual deficits            Pertinent Vitals/Pain Pain Assessment Pain Assessment: No/denies pain     Extremity/Trunk Assessment Upper Extremity Assessment Upper Extremity Assessment: Overall WFL for tasks assessed   Lower Extremity Assessment Lower Extremity Assessment: Overall WFL for tasks assessed   Cervical / Trunk Assessment Cervical / Trunk Assessment: Back Surgery   Communication Communication Communication: No apparent difficulties   Cognition Arousal: Alert Behavior During Therapy: WFL for tasks assessed/performed Cognition: No apparent impairments       Following commands: Intact       Cueing  General Comments   Cueing Techniques: Verbal cues  Pt family present for education           Home Living Family/patient expects to be discharged to:: Private residence Living Arrangements: Non-relatives/Friends Available Help at Discharge: Friend(s);Available PRN/intermittently Type of Home: House Home Access: Stairs to enter [employer] of Steps: 4 Entrance Stairs-Rails: Can reach both Home Layout: One level     Bathroom Shower/Tub: Chief Strategy Officer: Standard     Home Equipment: None          Prior Functioning/Environment Prior Level of Function : Independent/Modified Independent;Working/employed;Driving  Mobility Comments: No AD      OT Problem List: Pain        OT Goals(Current goals can be found in the care plan section)   Acute Rehab OT Goals Patient Stated Goal: To go home OT Goal Formulation: All assessment and education complete, DC therapy Time For Goal Achievement: 07/09/24 Potential to Achieve Goals: Good    AM-PAC OT 6 Clicks Daily Activity     Outcome Measure Help from another person eating meals?: None Help from another person taking care of personal grooming?: None Help from another person toileting, which includes using toliet, bedpan, or urinal?: None Help from another person bathing (including washing, rinsing, drying)?: None Help from another person to put on and taking off regular upper body clothing?: None Help from another person to put on and taking off regular lower body clothing?: None 6 Click Score: 24   End of Session Nurse Communication: Mobility status  Activity Tolerance: Patient tolerated treatment well Patient left: in bed;with call bell/phone within reach  OT Visit Diagnosis: Unsteadiness on feet (R26.81);Other abnormalities of gait and mobility (R26.89)                Time: 8377-8365 OT Time Calculation (min): 12 min Charges:  OT General Charges $OT Visit: 1 Visit OT Evaluation $OT Eval Moderate Complexity: 1 Mod Adrianne BROCKS, OT  Acute Rehabilitation Services Office [phone] Secure chat preferred   Adrianne GORMAN Savers 06/25/2024, 5:11 PM

## 2024-06-25 NOTE — Progress Notes (Signed)
 PT Cancellation Note  Patient Details Name: Greg Mason MRN: [id number] DOB: 1971-01-11   Cancelled Treatment:    Reason Eval/Treat Not Completed: PT screened. Pt is independent for mobility with no PT needs identified. Acute PT signing off. Please re-consult if new needs arise.  Kate ORN, PT, DPT Secure Chat Preferred  Rehab Office [phone]   Kate BRAVO [name] 06/25/2024, 4:56 PM

## 2024-06-25 NOTE — Plan of Care (Signed)
Pt doing well. Pt and wife given D/C instructions with verbal understanding. Rx's were sent to the pharmacy by MD. Pt's incision is clean and dry with no sign of infection. Pt's IV was removed prior to D/C. Pt D/C'd home via walking per MD order. Pt is stable @ D/C and has no other needs at this time. Holli Humbles, RN

## 2024-06-25 NOTE — H&P (Signed)
 BP (!) 153/98   Pulse 80   Temp 98.2 F (36.8 C) (Oral)   Resp 18   Ht 6' (1.829 m)   Wt 87.5 kg   SpO2 99%   BMI 26.18 kg/m   Greg Mason returns with a CT of the lumbar spine.  There is no hardware that is in the neural element in the spinal canal.  There is some bone, however, on the left side that is still narrowing the neural foramen, and I think I should revise this area and just move the screw more laterally and get a bigger bite of the bone.  It skims the lateral surface of the canal, but it does not enter it.  On the right side, the screw already has a halo around it and it appears to be toggling to some degree.  The right L5 screw just needs to be removed and replaced with a bigger screw.  I will certainly add some BMP this go round.  I just need to make sure that the left-sided L5 root is completely free and again just move the screw more lateral on the left L5 pedicle.  No Known Allergies Past Medical History:  Diagnosis Date   Arthritis    Headache    Migraines years ago   Hyperlipidemia    Pneumonia    Past Surgical History:  Procedure Laterality Date   ANKLE SURGERY Left    Plate placed after fracture   APPENDECTOMY     As a child   KNEE ARTHROPLASTY Left    [facility]   UMBILICAL HERNIA REPAIR  2017   Family History  Problem Relation Age of Onset   Diabetes Mother    Seizures Sister    Social History   Socioeconomic History   Marital status: Divorced    Spouse name: Not on file   Number of children: Not on file   Years of education: Not on file   Highest education level: Some college, no degree  Occupational History   Not on file  Tobacco Use   Smoking status: Never   Smokeless tobacco: Current    Types: Snuff   Tobacco comments:    Dips every day  Vaping Use   Vaping status: Never Used  Substance and Sexual Activity   Alcohol use: Never   Drug use: Never   Sexual activity: Yes  Other Topics Concern   Not on file  Social History Narrative    Not on file   Social Drivers of Health   Financial Resource Strain: Low Risk  (10/15/2023)   Overall Financial Resource Strain (CARDIA)    Difficulty of Paying Living Expenses: Not very hard  Food Insecurity: No Food Insecurity (11/30/2023)   Hunger Vital Sign    Worried About Running Out of Food in the Last Year: Never true    Ran Out of Food in the Last Year: Never true  Transportation Needs: No Transportation Needs (11/30/2023)   PRAPARE - Administrator, Civil Service (Medical): No    Lack of Transportation (Non-Medical): No  Physical Activity: Sufficiently Active (10/15/2023)   Exercise Vital Sign    Days of Exercise per Week: 7 days    Minutes of Exercise per Session: 150+ min  Stress: No Stress Concern Present (10/15/2023)   [employer] of Occupational Health - Occupational Stress Questionnaire    Feeling of Stress : Only a little  Social Connections: Moderately Integrated (10/15/2023)   Social Connection and Isolation Panel  Frequency of Communication with Friends and Family: Twice a week    Frequency of Social Gatherings with Friends and Family: Once a week    Attends Religious Services: More than 4 times per year    Active Member of [employer] or Organizations: Yes    Attends Engineer, structural: More than 4 times per year    Marital Status: Divorced  Intimate Partner Violence: Not At Risk (11/30/2023)   Humiliation, Afraid, Rape, and Kick questionnaire    Fear of Current or Ex-Partner: No    Emotionally Abused: No    Physically Abused: No    Sexually Abused: No   Physical Exam Constitutional:      Appearance: Normal appearance. He is normal weight.  HENT:     Head: Normocephalic and atraumatic.     Right Ear: Tympanic membrane and external ear normal.     Left Ear: Tympanic membrane and external ear normal.     Nose: Nose normal.     Mouth/Throat:     Mouth: Mucous membranes are moist.     Pharynx: Oropharynx is clear.  Eyes:      Extraocular Movements: Extraocular movements intact.     Pupils: Pupils are equal, round, and reactive to light.  Cardiovascular:     Rate and Rhythm: Normal rate and regular rhythm.  Pulmonary:     Effort: Pulmonary effort is normal.     Breath sounds: Normal breath sounds.  Abdominal:     General: Abdomen is flat.  Musculoskeletal:        General: Normal range of motion.     Cervical back: Normal range of motion and neck supple.  Skin:    General: Skin is warm and dry.  Neurological:     General: No focal deficit present.     Mental Status: He is alert and oriented to person, place, and time.     Motor: No weakness.  Psychiatric:        Mood and Affect: Mood normal.        Behavior: Behavior normal.        Thought Content: Thought content normal.        Judgment: Judgment normal.    Prior to Admission medications   Medication Sig Start Date End Date Taking? Authorizing Provider  diazepam  (VALIUM ) 5 MG tablet Take 1 tablet (5 mg total) by mouth every 6 (six) hours as needed for muscle spasms. 01/25/24   Gillie Duncans, MD  OVER THE COUNTER MEDICATION Take 1 capsule by mouth in the morning. Kratom Supplement    [provider]  sildenafil  (VIAGRA ) 100 MG tablet Take 1 tablet (100 mg total) by mouth daily as needed for erectile dysfunction. 10/16/23   [name], Marry Lenis, FNP

## 2024-06-25 NOTE — Transfer of Care (Signed)
 Immediate Anesthesia Transfer of Care Note  Patient: Greg Mason  Procedure(s) Performed: REVISION, SPINAL HARDWARE LUMBAR FIVE SCREW  Patient Location: PACU  Anesthesia Type:General  Level of Consciousness: awake, alert , oriented, and patient cooperative  Airway & Oxygen Therapy: Patient Spontanous Breathing  Post-op Assessment: Report given to RN, Post -op Vital signs reviewed and stable, Patient moving all extremities X 4, and Patient able to stick tongue midline  Post vital signs: Reviewed and stable  Last Vitals:  Vitals Value Taken Time  BP 127/98 06/25/24 12:17  Temp 97.7   Pulse 92 06/25/24 12:20  Resp 9 06/25/24 12:20  SpO2 100 % 06/25/24 12:20  Vitals shown include unfiled device data.  Last Pain:  Vitals:   06/25/24 0652  TempSrc:   PainSc: 7       Patients Stated Pain Goal: 2 (06/25/24 9347)  Complications: No notable events documented.

## 2024-06-25 NOTE — Anesthesia Procedure Notes (Signed)
 Procedure Name: Intubation Date/Time: 06/25/2024 9:31 AM  Performed by: Viviana Almarie DASEN, CRNAPre-anesthesia Checklist: Patient identified, Emergency Drugs available, Suction available and Patient being monitored Patient Re-evaluated:Patient Re-evaluated prior to induction Oxygen Delivery Method: Circle System Utilized Preoxygenation: Pre-oxygenation with 100% oxygen Induction Type: IV induction Ventilation: Mask ventilation without difficulty Laryngoscope Size: 4 and Mac Grade View: Grade I Tube type: Oral Number of attempts: 1 Airway Equipment and Method: Stylet and Bite block Placement Confirmation: ETT inserted through vocal cords under direct vision, positive ETCO2 and breath sounds checked- equal and bilateral Secured at: 23 cm Tube secured with: Tape Dental Injury: Teeth and Oropharynx as per pre-operative assessment

## 2024-07-10 ENCOUNTER — Other Ambulatory Visit: Payer: Self-pay | Admitting: Neurosurgery

## 2024-07-10 DIAGNOSIS — M4317 Spondylolisthesis, lumbosacral region: Secondary | ICD-10-CM

## 2024-07-11 NOTE — Anesthesia Postprocedure Evaluation (Signed)
 Anesthesia Post Note  Patient: Greg Mason  Procedure(s) Performed: REVISION, SPINAL HARDWARE LUMBAR FIVE SCREW     Patient location during evaluation: PACU Anesthesia Type: General Level of consciousness: awake Pain management: pain level controlled Vital Signs Assessment: post-procedure vital signs reviewed and stable Respiratory status: spontaneous breathing, nonlabored ventilation and respiratory function stable Cardiovascular status: blood pressure returned to baseline and stable Postop Assessment: no apparent nausea or vomiting Anesthetic complications: no   No notable events documented.  Last Vitals:  Vitals:   06/25/24 1345 06/25/24 1727  BP: 133/68 120/80  Pulse:  80  Resp: 10 18  Temp: 36.6 C 36.6 C  SpO2: 100% 100%    Last Pain:  Vitals:   06/25/24 1615  TempSrc:   PainSc: 5                  Angelic Schnelle P Ziasia Lenoir

## 2024-07-15 ENCOUNTER — Ambulatory Visit
Admission: EM | Admit: 2024-07-15 | Discharge: 2024-07-15 | Disposition: A | Discharge status: Home / Self Care | Attending: Family Medicine | Admitting: Family Medicine

## 2024-07-15 ENCOUNTER — Encounter: Payer: Self-pay | Admitting: Emergency Medicine

## 2024-07-15 ENCOUNTER — Ambulatory Visit

## 2024-07-15 ENCOUNTER — Emergency Department (HOSPITAL_BASED_OUTPATIENT_CLINIC_OR_DEPARTMENT_OTHER): Admitting: Radiology

## 2024-07-15 ENCOUNTER — Emergency Department (HOSPITAL_BASED_OUTPATIENT_CLINIC_OR_DEPARTMENT_OTHER)

## 2024-07-15 ENCOUNTER — Emergency Department (HOSPITAL_BASED_OUTPATIENT_CLINIC_OR_DEPARTMENT_OTHER)
Admission: EM | Admit: 2024-07-15 | Discharge: 2024-07-15 | Disposition: A | Discharge status: Home / Self Care | Source: Ambulatory Visit

## 2024-07-15 DIAGNOSIS — M5126 Other intervertebral disc displacement, lumbar region: Secondary | ICD-10-CM | POA: Diagnosis not present

## 2024-07-15 DIAGNOSIS — M4317 Spondylolisthesis, lumbosacral region: Secondary | ICD-10-CM

## 2024-07-15 DIAGNOSIS — R079 Chest pain, unspecified: Secondary | ICD-10-CM | POA: Insufficient documentation

## 2024-07-15 DIAGNOSIS — Z4789 Encounter for other orthopedic aftercare: Secondary | ICD-10-CM | POA: Diagnosis not present

## 2024-07-15 DIAGNOSIS — R0789 Other chest pain: Secondary | ICD-10-CM | POA: Diagnosis not present

## 2024-07-15 DIAGNOSIS — S2241XA Multiple fractures of ribs, right side, initial encounter for closed fracture: Secondary | ICD-10-CM | POA: Diagnosis not present

## 2024-07-15 LAB — BASIC METABOLIC PANEL WITH GFR
Anion gap: 10 (ref 5–15)
BUN: 20 mg/dL (ref 6–20)
CO2: 29 mmol/L (ref 22–32)
Calcium: 10.1 mg/dL (ref 8.9–10.3)
Chloride: 102 mmol/L (ref 98–111)
Creatinine, Ser: 1.03 mg/dL (ref 0.61–1.24)
GFR, Estimated: >60 mL/min (ref >60)
Glucose, Bld: 131 mg/dL — ABNORMAL HIGH (ref 70–99)
Potassium: 4.4 mmol/L (ref 3.5–5.1)
Sodium: 140 mmol/L (ref 135–145)

## 2024-07-15 LAB — CBC
HCT: 40.4 % (ref 39.0–52.0)
Hemoglobin: 14.1 g/dL (ref 13.0–17.0)
MCH: 30.5 pg (ref 26.0–34.0)
MCHC: 34.9 g/dL (ref 30.0–36.0)
MCV: 87.4 fL (ref 80.0–100.0)
Platelets: 351 K/uL (ref 150–400)
RBC: 4.62 MIL/uL (ref 4.22–5.81)
RDW: 12 % (ref 11.5–15.5)
WBC: 6.1 K/uL (ref 4.0–10.5)
nRBC: 0 % (ref 0.0–0.2)

## 2024-07-15 LAB — TROPONIN T, HIGH SENSITIVITY
Troponin T High Sensitivity: <15 ng/L (ref 0–19)
Troponin T High Sensitivity: <15 ng/L (ref 0–19)

## 2024-07-15 MED ORDER — IOHEXOL 350 MG/ML SOLN
100.0000 mL | Freq: Once | INTRAVENOUS | Status: AC | PRN
Start: 2024-07-15 — End: 2024-07-15
  Administered 2024-07-15: 75 mL via INTRAVENOUS

## 2024-07-15 NOTE — ED Provider Notes (Addendum)
 Greg Mason CARE    CSN: [id number] Arrival date & time: 07/15/24  1038      History   Chief Complaint Chief Complaint  Patient presents with   Chest Pain    HPI Greg Mason is a 53 y.o. male.   Pleasant 53 year old gentleman is brought up in a wheelchair by his wife.  He was down in radiology getting x-rays prior to an appointment with his neurosurgeon, Dr. Lindalee.  He developed sudden acute chest pain after the x-rays were completed and was advised to come to the urgent care for evaluation.  Patient states she has had fleeting chest pains a couple of times before.  He has not had this evaluated. He does not have cardiac risk factors, no hypertension, no diabetes, no history of heart disease, no recent illness, no hyperlipidemia, no family history of heart problems, no cigarette smoking Patient just had lumbar revision surgery on 06/25/2024.  He was here for postoperative check. Denies any pain or cramping in his legs He states the pain is severe, central chest, he feels like he cannot breathe.  He also had a spell yesterday where he got sweaty and felt like he could not breathe but he did not have chest pain.     Past Medical History:  Diagnosis Date   Arthritis    Headache    Migraines years ago   Hyperlipidemia    Pneumonia     Patient Active Problem List   Diagnosis Date Noted   Painful orthopaedic hardware 06/25/2024   Spondylolisthesis at L5-S1 level 01/24/2024   Cellulitis of left hand 11/30/2023   Abscess 03/14/2023   Cellulitis of right arm 03/08/2023   Non-cardiac chest pain 02/21/2023    Past Surgical History:  Procedure Laterality Date   ANKLE SURGERY Left    Plate placed after fracture   APPENDECTOMY     As a child   KNEE ARTHROPLASTY Left    [facility]   UMBILICAL HERNIA REPAIR  2017       Home Medications    Prior to Admission medications   Medication Sig Start Date End Date Taking? Authorizing Provider  diazepam  (VALIUM )  5 MG tablet Take 1 tablet (5 mg total) by mouth every 6 (six) hours as needed for muscle spasms. 01/25/24  Yes Greg Duncans, MD  OVER THE COUNTER MEDICATION Take 1 capsule by mouth in the morning. Kratom Supplement   Yes [provider]  oxyCODONE  (OXY IR/ROXICODONE ) 5 MG immediate release tablet Take 1 tablet (5 mg total) by mouth every 3 (three) hours as needed for moderate pain (pain score 4-6). 06/25/24  Yes Greg Duncans, MD  sildenafil  (VIAGRA ) 100 MG tablet Take 1 tablet (100 mg total) by mouth daily as needed for erectile dysfunction. 10/16/23  Yes [name], Greg Lenis, FNP  tiZANidine (ZANAFLEX) 4 MG tablet Take 1 tablet (4 mg total) by mouth every 6 (six) hours as needed for muscle spasms. 06/25/24  Yes Greg Duncans, MD    Family History Family History  Problem Relation Age of Onset   Diabetes Mother    Seizures Sister     Social History Social History   Tobacco Use   Smoking status: Never   Smokeless tobacco: Current    Types: Snuff   Tobacco comments:    Dips every day  Vaping Use   Vaping status: Never Used  Substance Use Topics   Alcohol use: Never   Drug use: Never     Allergies   Patient  has no known allergies.   Review of Systems Review of Systems  See HPI Physical Exam Triage Vital Signs ED Triage Vitals [07/15/24 1055]  Encounter Vitals Group     BP (!) 166/91     Girls Systolic BP Percentile      Girls Diastolic BP Percentile      Boys Systolic BP Percentile      Boys Diastolic BP Percentile      Pulse Rate 63     Resp 20     Temp 98.1 F (36.7 C)     Temp Source Oral     SpO2 100 %     Weight      Height      Head Circumference      Peak Flow      Pain Score      Pain Loc      Pain Education      Exclude from Growth Chart    No data found.  Updated Vital Signs BP (!) 166/91 (BP Location: Right Arm)   Pulse 63   Temp 98.1 F (36.7 C) (Oral)   Resp 20   SpO2 100%      Physical Exam Constitutional:      General: He  is in acute distress.     Appearance: He is well-developed. He is ill-appearing.     Comments: Patient arrived at the urgent care center and was wheeled into an exam room by myself, actively clutching his chest and taking shallow breaths.  HENT:     Head: Normocephalic and atraumatic.  Eyes:     Conjunctiva/sclera: Conjunctivae normal.     Pupils: Pupils are equal, round, and reactive to light.  Neck:     Vascular: No JVD.  Cardiovascular:     Rate and Rhythm: Normal rate and regular rhythm.     Heart sounds: Normal heart sounds.  Pulmonary:     Effort: Pulmonary effort is normal. No respiratory distress.     Breath sounds: Normal breath sounds.  Abdominal:     General: Bowel sounds are normal. There is no distension.     Palpations: Abdomen is soft. There is no hepatomegaly or splenomegaly.     Tenderness: There is no abdominal tenderness.  Musculoskeletal:        General: Normal range of motion.     Cervical back: Normal range of motion and neck supple.     Right lower leg: No tenderness. No edema.     Left lower leg: No tenderness. No edema.  Skin:    General: Skin is warm and dry.  Neurological:     Mental Status: He is alert.      UC Treatments / Results  Labs (all labs ordered are listed, but only abnormal results are displayed) Labs Reviewed - No data to display  EKG-patient had a normal EKG.  Chest pain was resolving as the EKG was done.  Rate 60.  No ST or T wave changes.  No ectopy   Radiology No results found.  Procedures Procedures (including critical care time)  Medications Ordered in UC Medications - No data to display  Initial Impression / Assessment and Plan / UC Course  I have reviewed the triage vital signs and the nursing notes.  Pertinent labs & imaging results that were available during my care of the patient were reviewed by me and considered in my medical decision making (see chart for details).     I explained to the patient  the  differential diagnosis of his acute chest pain.  Even though his EKG is normal he still could have some cardiac etiology.  Could also be DVT given his recent surgery.  He needs additional testing to rule out a source musculoskeletal, lung, pleural, cardiac, GI. I did walk down and discussed his case with Dr. Lindalee.  He agrees that patient needs to go directly to the emergency room and he will see him later for his follow-up. I recommended calling 911 and transporting to the emergency room by ambulance.  Patient declines.  His chest pain has abated at this time.  His wife is here with him and agrees to drive him to the emergency room.  Report is called.  His vital signs are stable and he is hemodynamically stable for transfer Final Clinical Impressions(s) / UC Diagnoses   Final diagnoses:  Chest pain, unspecified type     Discharge Instructions      Go directly to the ER    ED Prescriptions   None    PDMP not reviewed this encounter.   Maranda Jamee Jacob, MD 07/15/24 1146    Maranda Jamee Jacob, MD 07/15/24 1330

## 2024-07-15 NOTE — Discharge Instructions (Addendum)
 Your PE workup today was unremarkable.  Your cardiac workup today was unremarkable as well.  I think follow back up with your PCP is reasonable.   I did refer you to cardiology.  Please follow-up with them.  They will give you a call to establish care.  If you have any, worsening chest pain or shortness of breath please come back to ED for further evaluation

## 2024-07-15 NOTE — ED Provider Notes (Signed)
 [place] EMERGENCY DEPARTMENT AT [facility] Provider Note   CSN: [id number] Arrival date & time: 07/15/24  1156     Patient presents with: Chest Pain   Greg Mason is a 53 y.o. male.  {Add pertinent medical, surgical, social history, OB history to HPI:7120} 53 year old male with a past medical history of chronic low back pain with recent lumbar spinal surgery presenting to the emergency department today with chest pain.  The patient states he had acute onset of chest pain while he was at a postoperative check with his primary care provider today.  The patient states that the pain was sharp in character.  States it did hurt to take a deep breath then.  He denies any leg pain or swelling.  Denies a history of DVT or pulmonary embolism.  He states that the pain is now improved.  He states that he has had 2 other episodes of this in the past few months leading up to today but today was the worst and came on more suddenly.  He came to the emergency department at time further evaluation after being sent here by his primary care provider.   Chest Pain      Prior to Admission medications   Medication Sig Start Date End Date Taking? Authorizing Provider  diazepam  (VALIUM ) 5 MG tablet Take 1 tablet (5 mg total) by mouth every 6 (six) hours as needed for muscle spasms. 01/25/24   Gillie Duncans, MD  OVER THE COUNTER MEDICATION Take 1 capsule by mouth in the morning. Kratom Supplement    [provider]  oxyCODONE  (OXY IR/ROXICODONE ) 5 MG immediate release tablet Take 1 tablet (5 mg total) by mouth every 3 (three) hours as needed for moderate pain (pain score 4-6). 06/25/24   Gillie Duncans, MD  sildenafil  (VIAGRA ) 100 MG tablet Take 1 tablet (100 mg total) by mouth daily as needed for erectile dysfunction. 10/16/23   [name], Marry Lenis, FNP  tiZANidine (ZANAFLEX) 4 MG tablet Take 1 tablet (4 mg total) by mouth every 6 (six) hours as needed for muscle spasms. 06/25/24   Gillie Duncans, MD    Allergies: Patient has no known allergies.    Review of Systems  Cardiovascular:  Positive for chest pain.  All other systems reviewed and are negative.   Updated Vital Signs BP (!) 153/92 (BP Location: Right Arm)   Pulse (!) 58   Temp 97.9 F (36.6 C)   Resp 16   SpO2 100%   Physical Exam Vitals and nursing note reviewed.   Gen: NAD Eyes: PERRL, EOMI HEENT: no oropharyngeal swelling Neck: trachea midline Resp: clear to auscultation bilaterally Card: RRR, no murmurs, rubs, or gallops Abd: nontender, nondistended Extremities: no calf tenderness, no edema Vascular: 2+ radial pulses bilaterally, 2+ DP pulses bilaterally Neuro: No focal deficits Skin: no rashes Psyc: acting appropriately   (all labs ordered are listed, but only abnormal results are displayed) Labs Reviewed  CBC  BASIC METABOLIC PANEL WITH GFR  TROPONIN T, HIGH SENSITIVITY    EKG: EKG Interpretation Date/Time:  Tuesday July 15 2024 12:12:22 EDT Ventricular Rate:  60 PR Interval:  195 QRS Duration:  84 QT Interval:  443 QTC Calculation: 443 R Axis:   80  Text Interpretation: Sinus rhythm Nonspecific ST abnormality Baseline wander in lead(s) V1 Confirmed by Ula Barter [phone]) on 07/15/2024 12:36:01 PM  Radiology: No results found.  {Document cardiac monitor, telemetry assessment procedure when appropriate:32947} Procedures   Medications Ordered in the ED -  No data to display    {Click here for ABCD2, HEART and other calculators REFRESH Note before signing:1}                              Medical Decision Making 53 year old male with past medical history of chronic low back pain with recent surgery presenting to the emergency department today with chest pain.  I will further evaluate patient here with basic labs as well as an EKG, chest x-ray, and troponin to eval for ACS, pulmonary edema, pulmonary for trace, pneumothorax.  Will obtain CT angiogram to evaluate for pulmonary  embolism given his recent surgery as he is high risk for pulmonary embolism at this time based on description of his symptoms.  I will reevaluate for ultimate disposition.  If workup is unremarkable I think that he may be potentially discharged with cardiology follow-up.  Amount and/or Complexity of Data Reviewed Labs: ordered. Radiology: ordered.   ***  {Document critical care time when appropriate  Document review of labs and clinical decision tools ie CHADS2VASC2, etc  Document your independent review of radiology images and any outside records  Document your discussion with family members, caretakers and with consultants  Document social determinants of health affecting pt's care  Document your decision making why or why not admission, treatments were needed:32947:::1}   Final diagnoses:  None    ED Discharge Orders     None

## 2024-07-15 NOTE — Discharge Instructions (Signed)
 Go directly to the ER.

## 2024-07-15 NOTE — ED Notes (Signed)
 Patient is being discharged from the Urgent Care and sent to the Emergency Department via private vehicle . Per Dr Maranda, patient is in need of higher level of care due to further evaluation. Patient is aware and verbalizes understanding of plan of care.  Vitals:   07/15/24 1055  BP: (!) 166/91  Pulse: 63  Resp: 20  Temp: 98.1 F (36.7 C)  SpO2: 100%

## 2024-07-15 NOTE — ED Triage Notes (Signed)
 Patient c/o centralized chest pain and SOB that started today while getting an xray.  No meds given.

## 2024-07-15 NOTE — ED Triage Notes (Signed)
 C/o central CP with some mild SHOB starting today. Seen at [facility] and sent here. NAD.

## 2024-08-15 ENCOUNTER — Ambulatory Visit: Payer: Self-pay | Admitting: Family Medicine

## 2024-08-15 VITALS — BP 115/73 | HR 78 | Temp 97.3°F | Ht 72.0 in | Wt 195.0 lb

## 2024-08-15 DIAGNOSIS — Z1329 Encounter for screening for other suspected endocrine disorder: Secondary | ICD-10-CM

## 2024-08-15 DIAGNOSIS — M545 Low back pain, unspecified: Secondary | ICD-10-CM

## 2024-08-15 DIAGNOSIS — Z9889 Other specified postprocedural states: Secondary | ICD-10-CM | POA: Diagnosis not present

## 2024-08-15 DIAGNOSIS — M549 Dorsalgia, unspecified: Secondary | ICD-10-CM

## 2024-08-15 DIAGNOSIS — Z125 Encounter for screening for malignant neoplasm of prostate: Secondary | ICD-10-CM

## 2024-08-15 DIAGNOSIS — Z130 Encounter for screening for diseases of the blood and blood-forming organs and certain disorders involving the immune mechanism: Secondary | ICD-10-CM | POA: Diagnosis not present

## 2024-08-15 DIAGNOSIS — M48061 Spinal stenosis, lumbar region without neurogenic claudication: Secondary | ICD-10-CM | POA: Insufficient documentation

## 2024-08-15 DIAGNOSIS — Z136 Encounter for screening for cardiovascular disorders: Secondary | ICD-10-CM | POA: Diagnosis not present

## 2024-08-15 DIAGNOSIS — Z1321 Encounter for screening for nutritional disorder: Secondary | ICD-10-CM | POA: Diagnosis not present

## 2024-08-15 DIAGNOSIS — N528 Other male erectile dysfunction: Secondary | ICD-10-CM | POA: Diagnosis not present

## 2024-08-15 DIAGNOSIS — Z13228 Encounter for screening for other metabolic disorders: Secondary | ICD-10-CM | POA: Diagnosis not present

## 2024-08-15 MED ORDER — CYCLOBENZAPRINE HCL 10 MG PO TABS: 10.0000 mg | ORAL_TABLET | Freq: Two times a day (BID) | ORAL | 1 refills | Status: AC | PRN

## 2024-08-15 MED ORDER — SILDENAFIL CITRATE 100 MG PO TABS: 100.0000 mg | ORAL_TABLET | Freq: Every day | ORAL | 6 refills | Status: AC | PRN

## 2024-08-15 NOTE — Progress Notes (Signed)
 New Patient Office Visit  Patient ID: Greg Mason, Male   DOB: 04-09-1971 53 y.o. MRN: [id number]  Chief Complaint  Patient presents with   Establish Care   Subjective:     Greg Mason presents to establish care  HPI  Discussed the use of AI scribe software for clinical note transcription with the patient, who gave verbal consent to proceed.  History of Present Illness   Greg Mason is a 53 year old male who presents for re-establishment of care.   Concerns for diabetes mellitus - Patient requesting check for diabetes. Concern regarding potential diabetes due to family history on maternal side.  Hx of back surgeries performed by Dr. Gillie - 01/24/24 had back surgery -06/25/24 had a second back surgery due to persistent shooting pains down his leg after the first back surgery.  Patient reports that the shooting pains have improved since his second surgery but that he still has generalized back pain.  Denies currently taking any controlled pain medicines at this time.  Reports that muscle relaxants do help sometimes. Denies going through PT  Erectile dysfunction - Patient reports that he uses Viagra  100 mg and that it works well for him.  Denies any side effects.  Requesting refill today.  Social - Works in production manager  Denies any other significant medical hx       Outpatient Encounter Medications as of 08/15/2024  Medication Sig   diazepam  (VALIUM ) 5 MG tablet Take 1 tablet (5 mg total) by mouth every 6 (six) hours as needed for muscle spasms.   [DISCONTINUED] cyclobenzaprine  (FLEXERIL ) 10 MG tablet Take 10 mg by mouth 3 (three) times daily as needed.   [DISCONTINUED] sildenafil  (VIAGRA ) 100 MG tablet Take 1 tablet (100 mg total) by mouth daily as needed for erectile dysfunction.   [DISCONTINUED] tiZANidine  (ZANAFLEX ) 4 MG tablet Take 1 tablet (4 mg total) by mouth every 6 (six) hours as needed for muscle spasms.   cyclobenzaprine  (FLEXERIL ) 10 MG  tablet Take 1 tablet (10 mg total) by mouth 2 (two) times daily as needed.   sildenafil  (VIAGRA ) 100 MG tablet Take 1 tablet (100 mg total) by mouth daily as needed for erectile dysfunction.   [DISCONTINUED] OVER THE COUNTER MEDICATION Take 1 capsule by mouth in the morning. Kratom Supplement   [DISCONTINUED] oxyCODONE  (OXY IR/ROXICODONE ) 5 MG immediate release tablet Take 1 tablet (5 mg total) by mouth every 3 (three) hours as needed for moderate pain (pain score 4-6).   No facility-administered encounter medications on file as of 08/15/2024.    Past Medical History:  Diagnosis Date   Arthritis    Headache    Migraines years ago   Hyperlipidemia    Pneumonia     Past Surgical History:  Procedure Laterality Date   ANKLE SURGERY Left    Plate placed after fracture   APPENDECTOMY     As a child   KNEE ARTHROPLASTY Left    [facility]   UMBILICAL HERNIA REPAIR  2017    Family History  Problem Relation Age of Onset   Diabetes Mother    Seizures Sister     Social History   Socioeconomic History   Marital status: Divorced    Spouse name: Not on file   Number of children: Not on file   Years of education: Not on file   Highest education level: Some college, no degree  Occupational History   Not on file  Tobacco Use   Smoking status: Never  Smokeless tobacco: Current    Types: Snuff   Tobacco comments:    Dips every day  Vaping Use   Vaping status: Never Used  Substance and Sexual Activity   Alcohol use: Never   Drug use: Never   Sexual activity: Yes  Other Topics Concern   Not on file  Social History Narrative   Not on file   Social Drivers of Health   Financial Resource Strain: Low Risk  (10/15/2023)   Overall Financial Resource Strain (CARDIA)    Difficulty of Paying Living Expenses: Not very hard  Food Insecurity: No Food Insecurity (11/30/2023)   Hunger Vital Sign    Worried About Running Out of Food in the Last Year: Never true    Ran Out of Food  in the Last Year: Never true  Transportation Needs: No Transportation Needs (11/30/2023)   PRAPARE - Administrator, Civil Service (Medical): No    Lack of Transportation (Non-Medical): No  Physical Activity: Sufficiently Active (10/15/2023)   Exercise Vital Sign    Days of Exercise per Week: 7 days    Minutes of Exercise per Session: 150+ min  Stress: No Stress Concern Present (10/15/2023)   [employer] of Occupational Health - Occupational Stress Questionnaire    Feeling of Stress : Only a little  Social Connections: Moderately Integrated (10/15/2023)   Social Connection and Isolation Panel    Frequency of Communication with Friends and Family: Twice a week    Frequency of Social Gatherings with Friends and Family: Once a week    Attends Religious Services: More than 4 times per year    Active Member of [employer] or Organizations: Yes    Attends Banker Meetings: More than 4 times per year    Marital Status: Divorced  Intimate Partner Violence: Not At Risk (11/30/2023)   Humiliation, Afraid, Rape, and Kick questionnaire    Fear of Current or Ex-Partner: No    Emotionally Abused: No    Physically Abused: No    Sexually Abused: No    ROS    Objective:    BP 115/73   Pulse 78   Temp (!) 97.3 F (36.3 C)   Ht 6' (1.829 m)   Wt 195 lb (88.5 kg)   SpO2 99%   BMI 26.45 kg/m   Physical Exam Vitals reviewed.  Constitutional:      Appearance: Normal appearance.  HENT:     Head: Normocephalic and atraumatic.  Eyes:     Extraocular Movements: Extraocular movements intact.     Conjunctiva/sclera: Conjunctivae normal.     Pupils: Pupils are equal, round, and reactive to light.  Cardiovascular:     Rate and Rhythm: Normal rate and regular rhythm.     Pulses: Normal pulses.     Heart sounds: Normal heart sounds. No murmur heard. Pulmonary:     Effort: Pulmonary effort is normal. No respiratory distress.     Breath sounds: Normal breath sounds.   Musculoskeletal:        General: No deformity. Normal range of motion.     Cervical back: Normal range of motion.  Skin:    General: Skin is warm and dry.     Capillary Refill: Capillary refill takes less than 2 seconds.     Comments: Well healed vertical incision over lower spine  Neurological:     General: No focal deficit present.     Mental Status: He is alert and oriented to person, place, and time.  Psychiatric:        Mood and Affect: Mood normal.        Behavior: Behavior normal.           Assessment & Plan:   Problem List Items Addressed This Visit   None Visit Diagnoses       Back pain with history of spinal surgery    -  Primary   Relevant Medications   cyclobenzaprine  (FLEXERIL ) 10 MG tablet   Other Relevant Orders   Ambulatory referral to Physical Therapy     Low back pain, unspecified back pain laterality, unspecified chronicity, unspecified whether sciatica present       Relevant Medications   cyclobenzaprine  (FLEXERIL ) 10 MG tablet   Other Relevant Orders   Ambulatory referral to Physical Therapy     Other male erectile dysfunction       Relevant Medications   sildenafil  (VIAGRA ) 100 MG tablet     Screening for endocrine, nutritional, metabolic and immunity disorder       Relevant Orders   Bayer DCA Hb A1c Waived   Lipid Panel   CBC with Differential   Comprehensive metabolic panel with GFR     Encounter for screening for cardiovascular disorders       Relevant Orders   Lipid Panel     Prostate cancer screening       Relevant Orders   PSA       Assessment and Plan  Routine screening labs ordered, results pending.  Back pain and hx of back surgery - Placed referral for PT - Ordered flexeril  for prn use. Discussed side effects including dizziness and sedation.  - Patient to continue f/u with Dr. Gillie   Erectile dysfunction - Refilled Viagra     Return in about 3 months (around 11/15/2024) for annual physical .   Oneil LELON Severin,  FNP [facility]

## 2024-08-16 LAB — CBC WITH DIFFERENTIAL/PLATELET
Basophils Absolute: 0.1 x10E3/uL (ref 0.0–0.2)
Basos: 1 %
EOS (ABSOLUTE): 0.1 x10E3/uL (ref 0.0–0.4)
Eos: 2 %
Hematocrit: 38.8 % (ref 37.5–51.0)
Hemoglobin: 12.9 g/dL — ABNORMAL LOW (ref 13.0–17.7)
Immature Grans (Abs): 0 x10E3/uL (ref 0.0–0.1)
Immature Granulocytes: 0 %
Lymphocytes Absolute: 2.1 x10E3/uL (ref 0.7–3.1)
Lymphs: 27 %
MCH: 29.9 pg (ref 26.6–33.0)
MCHC: 33.2 g/dL (ref 31.5–35.7)
MCV: 90 fL (ref 79–97)
Monocytes Absolute: 0.6 x10E3/uL (ref 0.1–0.9)
Monocytes: 8 %
Neutrophils Absolute: 4.9 x10E3/uL (ref 1.4–7.0)
Neutrophils: 62 %
Platelets: 302 x10E3/uL (ref 150–450)
RBC: 4.31 x10E6/uL (ref 4.14–5.80)
RDW: 12.5 % (ref 11.6–15.4)
WBC: 7.8 x10E3/uL (ref 3.4–10.8)

## 2024-08-16 LAB — COMPREHENSIVE METABOLIC PANEL WITH GFR
ALT: 24 IU/L (ref 0–44)
AST: 27 IU/L (ref 0–40)
Albumin: 4.1 g/dL (ref 3.8–4.9)
Alkaline Phosphatase: 110 IU/L (ref 47–123)
BUN/Creatinine Ratio: 16 (ref 9–20)
BUN: 22 mg/dL (ref 6–24)
Bilirubin Total: 0.3 mg/dL (ref 0.0–1.2)
CO2: 23 mmol/L (ref 20–29)
Calcium: 9.5 mg/dL (ref 8.7–10.2)
Chloride: 102 mmol/L (ref 96–106)
Creatinine, Ser: 1.34 mg/dL — ABNORMAL HIGH (ref 0.76–1.27)
Globulin, Total: 2.2 g/dL (ref 1.5–4.5)
Glucose: 84 mg/dL (ref 70–99)
Potassium: 4.2 mmol/L (ref 3.5–5.2)
Sodium: 140 mmol/L (ref 134–144)
Total Protein: 6.3 g/dL (ref 6.0–8.5)
eGFR: 63 mL/min/1.73 (ref >59)

## 2024-08-16 LAB — LIPID PANEL
Chol/HDL Ratio: 2.7 ratio (ref 0.0–5.0)
Cholesterol, Total: 173 mg/dL (ref 100–199)
HDL: 63 mg/dL (ref >39)
LDL Chol Calc (NIH): 92 mg/dL (ref 0–99)
Triglycerides: 97 mg/dL (ref 0–149)
VLDL Cholesterol Cal: 18 mg/dL (ref 5–40)

## 2024-08-16 LAB — PSA: Prostate Specific Ag, Serum: 0.6 ng/mL (ref 0.0–4.0)

## 2024-08-18 LAB — BAYER DCA HB A1C WAIVED: HB A1C (BAYER DCA - WAIVED): 5.4 % (ref 4.8–5.6)

## 2024-08-19 ENCOUNTER — Ambulatory Visit: Payer: Self-pay | Admitting: Family Medicine

## 2024-08-19 DIAGNOSIS — R7989 Other specified abnormal findings of blood chemistry: Secondary | ICD-10-CM

## 2024-08-19 NOTE — Telephone Encounter (Signed)
 Reviewed results with patient and he voiced understanding. He scheduled a 4 wk lab appt on 12/22 as requested by pcp.

## 2024-08-27 ENCOUNTER — Ambulatory Visit: Admitting: Physical Therapy

## 2024-08-27 ENCOUNTER — Other Ambulatory Visit: Payer: Self-pay

## 2024-08-27 DIAGNOSIS — M549 Dorsalgia, unspecified: Secondary | ICD-10-CM | POA: Diagnosis not present

## 2024-08-27 DIAGNOSIS — M5459 Other low back pain: Secondary | ICD-10-CM | POA: Diagnosis present

## 2024-08-27 DIAGNOSIS — R293 Abnormal posture: Secondary | ICD-10-CM | POA: Insufficient documentation

## 2024-08-27 DIAGNOSIS — M545 Low back pain, unspecified: Secondary | ICD-10-CM | POA: Insufficient documentation

## 2024-08-27 DIAGNOSIS — Z9889 Other specified postprocedural states: Secondary | ICD-10-CM | POA: Diagnosis not present

## 2024-08-27 DIAGNOSIS — M62830 Muscle spasm of back: Secondary | ICD-10-CM | POA: Diagnosis present

## 2024-08-27 NOTE — Therapy (Signed)
 OUTPATIENT PHYSICAL THERAPY THORACOLUMBAR EVALUATION   Patient Name: Greg Mason MRN: [id number] DOB:June 29, 1971, 53 y.o., male Today's Date: 08/27/2024  END OF SESSION:  PT End of Session - 08/27/24 1653     Visit Number 1    Number of Visits 12    Date for Recertification  10/15/24    PT Start Time 0156    PT Stop Time 0223    PT Time Calculation (min) 27 min    Activity Tolerance Patient tolerated treatment well    Behavior During Therapy [facility] for tasks assessed/performed          Past Medical History:  Diagnosis Date   Arthritis    Headache    Migraines years ago   Hyperlipidemia    Pneumonia    Past Surgical History:  Procedure Laterality Date   ANKLE SURGERY Left    Plate placed after fracture   APPENDECTOMY     As a child   KNEE ARTHROPLASTY Left    [facility]   UMBILICAL HERNIA REPAIR  2017     REFERRING PROVIDER: Oneil Severin FNP.  REFERRING DIAG: Back pain with h/o spinal surgery.    Rationale for Evaluation and Treatment: Rehabilitation  THERAPY DIAG:  Other low back pain  Muscle spasm of back  Abnormal posture  ONSET DATE: Ongoing, surgery 01/24/24 L5-S1 fusion and L3-4 decompression and revision on 06/25/24.    SUBJECTIVE:                                                                                                                                                                                           SUBJECTIVE STATEMENT: The patient presents to the clinic with c/o LBP.  He underwent an L5-S1 fusion on 01/24/24 and then a revision on 06/25/24.  He is very motivated to improve.  He continues to work at this time.  His pain is rated an 8/10.  He describes his pain as throbbing, ache, sore and shooting.  He has not found anything that really decreases his pain at this point.    PERTINENT HISTORY:  Left TKA.  C/o numbness in hand beginning to develop.  PAIN:  Are you having pain? Yes: NPRS scale: 8/10.   Pain location: LB Pain  description: As above.   Aggravating factors: More work activity. Relieving factors: As above.    PRECAUTIONS: Other: Per lumbar fusion protocol.    RED FLAGS: None   WEIGHT BEARING RESTRICTIONS: No  FALLS:  Has patient fallen in last 6 months? No  LIVING ENVIRONMENT: Lives in: House/apartment Has following equipment at home: None  PLOF: Independent  PATIENT GOALS: Normalcy would  be awesome.    OBJECTIVE:   PATIENT SURVEYS:  ODI:  21/50 (42%).     POSTURE: rounded shoulders, forward head, decreased lumbar lordosis, and flexed trunk   PALPATION: Tender to palpation on either side of his lumbar incisional site with a great deal of muscle tone.     LOWER EXTREMITY ROM:    Bilateral hip flexion assessed in supine is 105 degrees.  LOWER EXTREMITY MMT:    Normal bilateral knee and ankle strength.   GAIT: Antalgic gait pattern with patient walking in a flexed trunk posture.  TREATMENT DATE:                                                                                                                                  PATIENT EDUCATION:  Education details:  Person educated:  International aid/development worker:  Education comprehension:   HOME EXERCISE PROGRAM:   ASSESSMENT:  CLINICAL IMPRESSION: The patient presents to the clinic s/p L5-S1 fusion and subsequent revision.  He reports a high pain-level. He continues to work at this time.  His gait is antalgic in nature.  He exhibits normal bilateral knee and ankle strength.  He is tender to palpation on either side of his lumbar incisional site with a great deal of muscle tone.  His ODI score is 21/50(42%).  Patient will benefit from skilled physical therapy intervention to address pain and deficits.  OBJECTIVE IMPAIRMENTS: Abnormal gait, decreased activity tolerance, difficulty walking, decreased ROM, increased muscle spasms, postural dysfunction, and pain.   ACTIVITY LIMITATIONS: carrying, lifting, bending, standing, and  locomotion level  PARTICIPATION LIMITATIONS: meal prep, cleaning, laundry, occupation, and yard work  PERSONAL FACTORS: Time since onset of injury/illness/exacerbation and 1 comorbidity: 2 lumbar surgeries are also affecting patient's functional outcome.   REHAB POTENTIAL: Good-/Good  CLINICAL DECISION MAKING: Stable/uncomplicated  EVALUATION COMPLEXITY: Low   GOALS:   LONG TERM GOALS: Target date: 10/15/24  Ind with a HEP.  Goal status: INITIAL  2.  Stand 20 minutes with pain not > 4/10.  Goal status: INITIAL  3.  Walk in upright posture.  Goal status: INITIAL  4.  Perform ADL's with pain not > 4/10.  Goal status: INITIAL  5.  Improve ODI score by at least 6-7 points.  Goal status: INITIAL  PLAN:  PT FREQUENCY: 2x/week  PT DURATION: 6 weeks  PLANNED INTERVENTIONS: 97110-Therapeutic exercises, 97530- Therapeutic activity, [id number]- Neuromuscular re-education, 97535- Self Care, 02859- Manual therapy, [id number]- Electrical stimulation (unattended), 97035- Ultrasound, Patient/Family education, and Moist heat.  PLAN FOR NEXT SESSION: Modalities and STW/M as needed.  Core exercise progression.  Body mechanics training and spinal protection techniques per lumbar fusion protocol.       Khylee Algeo, PT 08/27/2024, 5:36 PM

## 2024-09-05 ENCOUNTER — Ambulatory Visit

## 2024-09-09 ENCOUNTER — Ambulatory Visit

## 2024-09-09 DIAGNOSIS — M5459 Other low back pain: Secondary | ICD-10-CM

## 2024-09-09 DIAGNOSIS — M62830 Muscle spasm of back: Secondary | ICD-10-CM

## 2024-09-09 DIAGNOSIS — R293 Abnormal posture: Secondary | ICD-10-CM

## 2024-09-09 NOTE — Therapy (Signed)
 OUTPATIENT PHYSICAL THERAPY TREATMENT   Patient Name: Greg Mason MRN: [id number] DOB:10/16/1970, 53 y.o., male Today's Date: 09/09/2024  END OF SESSION:  PT End of Session - 09/09/24 1513     Visit Number 2    Number of Visits 12    Date for Recertification  10/15/24    PT Start Time 1515    PT Stop Time 1555    PT Time Calculation (min) 40 min    Activity Tolerance Patient tolerated treatment well    Behavior During Therapy WFL for tasks assessed/performed           Past Medical History:  Diagnosis Date   Arthritis    Headache    Migraines years ago   Hyperlipidemia    Pneumonia    Past Surgical History:  Procedure Laterality Date   ANKLE SURGERY Left    Plate placed after fracture   APPENDECTOMY     As a child   KNEE ARTHROPLASTY Left    [facility]   UMBILICAL HERNIA REPAIR  2017     REFERRING PROVIDER: Oneil Severin FNP.  REFERRING DIAG: Back pain with h/o spinal surgery.    Rationale for Evaluation and Treatment: Rehabilitation  THERAPY DIAG:  Other low back pain  Muscle spasm of back  Abnormal posture  ONSET DATE: Ongoing, surgery 01/24/24 L5-S1 fusion and L3-4 decompression and revision on 06/25/24.    SUBJECTIVE:                                                                                                                                                                                           SUBJECTIVE STATEMENT: Pt states he's been working today. Pt reports no nerve pain but it will radiate from his low back to hamstrings.   PERTINENT HISTORY:  Left TKA.  C/o numbness in hand beginning to develop.  PAIN:  Are you having pain? Yes: NPRS scale: 8/10.   Pain location: LB Pain description: As above.   Aggravating factors: More work activity. Relieving factors: As above.    PRECAUTIONS: Other: Per lumbar fusion protocol.    RED FLAGS: None   WEIGHT BEARING RESTRICTIONS: No  FALLS:  Has patient fallen in last 6 months?  No  LIVING ENVIRONMENT: Lives in: House/apartment Has following equipment at home: None  PLOF: Independent  PATIENT GOALS: Normalcy would be awesome.    OBJECTIVE:   PATIENT SURVEYS:  ODI:  21/50 (42%).     POSTURE: rounded shoulders, forward head, decreased lumbar lordosis, and flexed trunk   PALPATION: Tender to palpation on either side of his lumbar incisional site with a great deal of muscle  tone.     LOWER EXTREMITY ROM:    Bilateral hip flexion assessed in supine is 105 degrees.  LOWER EXTREMITY MMT:   Normal bilateral knee and ankle strength.  GAIT: Antalgic gait pattern with patient walking in a flexed trunk posture.  TREATMENT DATE:    09/09/24 Nustep L1 x 10 min LEs/UEs Standing lumbar ext against counter x10 Standing L stretch at counter x10 Standing gastroc stretch 2x30 Supine feet on green pball knee flex/ext 2x10 Supine figure 4 stretch x30 Supine piriformis stretch x30 Supine LTR x10 Supine deadbugs 10x3 Supine bridge 2x10 TENS premod, bilat lumbar paraspinals to upper glutes x15 min, intensity to pt tolerance with legs elevated  PATIENT EDUCATION:  Education details: HEP, TENS use and placement Person educated: Patient Education method: Verbal, handout, demonstration Education comprehension: Demonstrated  HOME EXERCISE PROGRAM: Access Code: [email or web address] URL: [email or web address] Date: 09/09/2024 Prepared by: Noemi Ishmael April Earnie Starring  Exercises - Supine Lower Trunk Rotation  - 1 x daily - 7 x weekly - 1 sets - 10 reps - Supine Piriformis Stretch with Foot on Ground  - 1 x daily - 7 x weekly - 1 sets - 2 reps - 30 sec hold - Supine Figure 4  - 1 x daily - 7 x weekly - 1 sets - 2 reps - 30 sec hold - Isometric Dead Bug  - 1 x daily - 7 x weekly - 1 sets - 10 reps - 3 sec hold - Standing Lumbar Extension with Counter  - 1 x daily - 7 x weekly - 1 sets - 10 reps - 3 sec hold - Standing 'L' Stretch at Counter  - 1 x daily  - 7 x weekly - 1 sets - 10 reps - 3 sec hold  ASSESSMENT:  CLINICAL IMPRESSION: Hips and hamstrings are quite taut. Worked on gentle lumbar and hip mobility this session. Initiated core and hip stabilizing exercises. Challenged with isometric deadbugs. Educated pt on TENs use at home  OBJECTIVE IMPAIRMENTS: Abnormal gait, decreased activity tolerance, difficulty walking, decreased ROM, increased muscle spasms, postural dysfunction, and pain.   ACTIVITY LIMITATIONS: carrying, lifting, bending, standing, and locomotion level  PARTICIPATION LIMITATIONS: meal prep, cleaning, laundry, occupation, and yard work  PERSONAL FACTORS: Time since onset of injury/illness/exacerbation and 1 comorbidity: 2 lumbar surgeries are also affecting patient's functional outcome.   REHAB POTENTIAL: Good-/Good  CLINICAL DECISION MAKING: Stable/uncomplicated  EVALUATION COMPLEXITY: Low   GOALS:   LONG TERM GOALS: Target date: 10/15/24  Ind with a HEP.  Goal status: INITIAL  2.  Stand 20 minutes with pain not > 4/10.  Goal status: INITIAL  3.  Walk in upright posture.  Goal status: INITIAL  4.  Perform ADL's with pain not > 4/10.  Goal status: INITIAL  5.  Improve ODI score by at least 6-7 points.  Goal status: INITIAL  PLAN:  PT FREQUENCY: 2x/week  PT DURATION: 6 weeks  PLANNED INTERVENTIONS: 97110-Therapeutic exercises, 97530- Therapeutic activity, [id number]- Neuromuscular re-education, 97535- Self Care, 02859- Manual therapy, G0283- Electrical stimulation (unattended), 97035- Ultrasound, Patient/Family education, and Moist heat.  PLAN FOR NEXT SESSION: Modalities and STW/M as needed.  Core exercise progression.  Body mechanics training and spinal protection techniques per lumbar fusion protocol.       Yaquelin Langelier April Ma L Zaxton Angerer, PT, DPT 09/09/2024, 3:14 PM

## 2024-09-11 ENCOUNTER — Ambulatory Visit

## 2024-09-11 DIAGNOSIS — M5459 Other low back pain: Secondary | ICD-10-CM | POA: Diagnosis not present

## 2024-09-11 DIAGNOSIS — M62830 Muscle spasm of back: Secondary | ICD-10-CM

## 2024-09-11 DIAGNOSIS — R293 Abnormal posture: Secondary | ICD-10-CM

## 2024-09-11 NOTE — Therapy (Signed)
 OUTPATIENT PHYSICAL THERAPY TREATMENT   Patient Name: Greg Mason MRN: [id number] DOB:07-Sep-1971, 53 y.o., male Today's Date: 09/11/2024  END OF SESSION:  PT End of Session - 09/11/24 1519     Visit Number 3    Number of Visits 12    Date for Recertification  10/15/24    PT Start Time 1515    PT Stop Time 1600    PT Time Calculation (min) 45 min    Activity Tolerance Patient tolerated treatment well    Behavior During Therapy WFL for tasks assessed/performed           Past Medical History:  Diagnosis Date   Arthritis    Headache    Migraines years ago   Hyperlipidemia    Pneumonia    Past Surgical History:  Procedure Laterality Date   ANKLE SURGERY Left    Plate placed after fracture   APPENDECTOMY     As a child   KNEE ARTHROPLASTY Left    [facility]   UMBILICAL HERNIA REPAIR  2017     REFERRING PROVIDER: Oneil Severin FNP.  REFERRING DIAG: Back pain with h/o spinal surgery.    Rationale for Evaluation and Treatment: Rehabilitation  THERAPY DIAG:  Other low back pain  Muscle spasm of back  Abnormal posture  ONSET DATE: Ongoing, surgery 01/24/24 L5-S1 fusion and L3-4 decompression and revision on 06/25/24.    SUBJECTIVE:                                                                                                                                                                                           SUBJECTIVE STATEMENT: Pt states he's been working today. Pt reports no nerve pain but it will radiate from his low back to hamstrings.   PERTINENT HISTORY:  Left TKA.  C/o numbness in hand beginning to develop.  PAIN:  Are you having pain? Yes: NPRS scale: 8/10.   Pain location: LB Pain description: As above.   Aggravating factors: More work activity. Relieving factors: As above.    PRECAUTIONS: Other: Per lumbar fusion protocol.    RED FLAGS: None   WEIGHT BEARING RESTRICTIONS: No  FALLS:  Has patient fallen in last 6 months?  No  LIVING ENVIRONMENT: Lives in: House/apartment Has following equipment at home: None  PLOF: Independent  PATIENT GOALS: Normalcy would be awesome.    OBJECTIVE:   PATIENT SURVEYS:  ODI:  21/50 (42%).     POSTURE: rounded shoulders, forward head, decreased lumbar lordosis, and flexed trunk   PALPATION: Tender to palpation on either side of his lumbar incisional site with a great deal of muscle  tone.     LOWER EXTREMITY ROM:    Bilateral hip flexion assessed in supine is 105 degrees.  LOWER EXTREMITY MMT:   Normal bilateral knee and ankle strength.  GAIT: Antalgic gait pattern with patient walking in a flexed trunk posture.  TREATMENT DATE:      09/11/24                                  EXERCISE LOG  Exercise Repetitions and Resistance Comments  Nustep Lvl 1 x 15 mins   Butterflies Blue XTS 3 sets of 10 reps   Extensions Blue XTS 3 sets of 10 reps   Cybex Lumbar Flexion 60# 3 sets of 10 reps   Cybex Lumbar Extension 60# 3 sets of 10 reps    Seated Chest Press 70# 3 sets of 10 reps     Blank cell = exercise not performed today   Modalities  Date:  Unattended Estim: Lumbar, IFC 80-150 Hz, 10 mins, Pain and Tone    09/09/24 Nustep L1 x 15 min LEs/UEs Standing lumbar ext against counter x10 Standing L stretch at counter x10 Standing gastroc stretch 2x30 Supine feet on green pball knee flex/ext 2x10 Supine figure 4 stretch x30 Supine piriformis stretch x30 Supine LTR x10 Supine deadbugs 10x3 Supine bridge 2x10 TENS premod, bilat lumbar paraspinals to upper glutes x15 min, intensity to pt tolerance with legs elevated  PATIENT EDUCATION:  Education details: HEP, TENS use and placement Person educated: Patient Education method: Verbal, handout, demonstration Education comprehension: Demonstrated  HOME EXERCISE PROGRAM: Access Code: [email or web address] URL: [email or web address] Date: 09/09/2024 Prepared by: Gellen April Earnie Starring  Exercises - Supine Lower Trunk Rotation  - 1 x daily - 7 x weekly - 1 sets - 10 reps - Supine Piriformis Stretch with Foot on Ground  - 1 x daily - 7 x weekly - 1 sets - 2 reps - 30 sec hold - Supine Figure 4  - 1 x daily - 7 x weekly - 1 sets - 2 reps - 30 sec hold - Isometric Dead Bug  - 1 x daily - 7 x weekly - 1 sets - 10 reps - 3 sec hold - Standing Lumbar Extension with Counter  - 1 x daily - 7 x weekly - 1 sets - 10 reps - 3 sec hold - Standing 'L' Stretch at Counter  - 1 x daily - 7 x weekly - 1 sets - 10 reps - 3 sec hold  ASSESSMENT:  CLINICAL IMPRESSION: Pt arrives for today's treatment session reporting > 5/10 low back pain.  Pt able to tolerate increased time on Nustep today for warm up without discomfort.  Pt able to tolerate introduction to XTS resisted exercises and lumbar cybex exercises with minimal discomfort.  Pt requiring min cues for proper technique with all newly added exercises.  Normal responses to estim noted upon removal.  Pt reported decreased pain at completion of today's treatment session.    OBJECTIVE IMPAIRMENTS: Abnormal gait, decreased activity tolerance, difficulty walking, decreased ROM, increased muscle spasms, postural dysfunction, and pain.   ACTIVITY LIMITATIONS: carrying, lifting, bending, standing, and locomotion level  PARTICIPATION LIMITATIONS: meal prep, cleaning, laundry, occupation, and yard work  PERSONAL FACTORS: Time since onset of injury/illness/exacerbation and 1 comorbidity: 2 lumbar surgeries are also affecting patient's functional outcome.   REHAB POTENTIAL: Good-/Good  CLINICAL DECISION MAKING: Stable/uncomplicated  EVALUATION COMPLEXITY: Low   GOALS:  LONG TERM GOALS: Target date: 10/15/24  Ind with a HEP.  Goal status: INITIAL  2.  Stand 20 minutes with pain not > 4/10.  Goal status: INITIAL  3.  Walk in upright posture.  Goal status: INITIAL  4.  Perform ADL's with pain not > 4/10.  Goal status:  INITIAL  5.  Improve ODI score by at least 6-7 points.  Goal status: INITIAL  PLAN:  PT FREQUENCY: 2x/week  PT DURATION: 6 weeks  PLANNED INTERVENTIONS: 97110-Therapeutic exercises, 97530- Therapeutic activity, [id number]- Neuromuscular re-education, 97535- Self Care, 02859- Manual therapy, G0283- Electrical stimulation (unattended), 97035- Ultrasound, Patient/Family education, and Moist heat.  PLAN FOR NEXT SESSION: Modalities and STW/M as needed.  Core exercise progression.  Body mechanics training and spinal protection techniques per lumbar fusion protocol.       Delon DELENA Gosling, PTA, DPT 09/11/2024, 4:47 PM

## 2024-09-15 ENCOUNTER — Other Ambulatory Visit

## 2024-09-16 ENCOUNTER — Ambulatory Visit: Admitting: *Deleted

## 2024-09-16 DIAGNOSIS — M5459 Other low back pain: Secondary | ICD-10-CM | POA: Diagnosis not present

## 2024-09-16 DIAGNOSIS — M62830 Muscle spasm of back: Secondary | ICD-10-CM

## 2024-09-16 NOTE — Therapy (Signed)
 " OUTPATIENT PHYSICAL THERAPY TREATMENT   Patient Name: Greg Mason MRN: [id number] DOB:[birth year], 53 y.o., male Today's Date: 09/16/2024  END OF SESSION:  PT End of Session - 09/16/24 1608     Visit Number 4    Number of Visits 12    Date for Recertification  10/15/24    PT Start Time 1600    PT Stop Time 1650    PT Time Calculation (min) 50 min           Past Medical History:  Diagnosis Date   Arthritis    Headache    Migraines years ago   Hyperlipidemia    Pneumonia    Past Surgical History:  Procedure Laterality Date   ANKLE SURGERY Left    Plate placed after fracture   APPENDECTOMY     As a child   KNEE ARTHROPLASTY Left    [facility]   UMBILICAL HERNIA REPAIR  2017     REFERRING PROVIDER: Oneil Severin FNP.  REFERRING DIAG: Back pain with h/o spinal surgery.    Rationale for Evaluation and Treatment: Rehabilitation  THERAPY DIAG:  Other low back pain  Muscle spasm of back  ONSET DATE: Ongoing, surgery 01/24/24 L5-S1 fusion and L3-4 decompression and revision on 06/25/24.    SUBJECTIVE:                                                                                                                                                                                           SUBJECTIVE STATEMENT: Pt states he's been working today.   PERTINENT HISTORY:  Left TKA.  C/o numbness in hand beginning to develop.  PAIN:  Are you having pain? Yes: NPRS scale: 8/10.   Pain location: LB Pain description: As above.   Aggravating factors: More work activity. Relieving factors: As above.    PRECAUTIONS: Other: Per lumbar fusion protocol.    RED FLAGS: None   WEIGHT BEARING RESTRICTIONS: No  FALLS:  Has patient fallen in last 6 months? No  LIVING ENVIRONMENT: Lives in: House/apartment Has following equipment at home: None  PLOF: Independent  PATIENT GOALS: Normalcy would be awesome.    OBJECTIVE:   PATIENT SURVEYS:  ODI:  21/50  (42%).     POSTURE: rounded shoulders, forward head, decreased lumbar lordosis, and flexed trunk   PALPATION: Tender to palpation on either side of his lumbar incisional site with a great deal of muscle tone.     LOWER EXTREMITY ROM:    Bilateral hip flexion assessed in supine is 105 degrees.  LOWER EXTREMITY MMT:   Normal bilateral knee and ankle strength.  GAIT: Antalgic gait  pattern with patient walking in a flexed trunk posture.  TREATMENT DATE:   09/16/24 Nustep x 15 mins L4  AB Bracing, Reviewed HEP and put LTR on hold  AB bracing with transitional movements and ADL's practiced and movement patterns to decrease pain triggers   09/11/24                                  EXERCISE LOG  Exercise Repetitions and Resistance Comments  Nustep Lvl 1 x 15 mins   Butterflies Blue XTS 3 sets of 10 reps   Extensions Blue XTS 3 sets of 10 reps   Cybex Lumbar Flexion 60# 3 sets of 10 reps   Cybex Lumbar Extension 60# 3 sets of 10 reps    Seated Chest Press 70# 3 sets of 10 reps     Blank cell = exercise not performed today   Modalities  Date:  Unattended Estim: Lumbar, IFC 80-150 Hz, 10 mins, Pain and Tone    09/09/24 Nustep L1 x 15 min LEs/UEs Standing lumbar ext against counter x10 Standing L stretch at counter x10 Standing gastroc stretch 2x30 Supine feet on green pball knee flex/ext 2x10 Supine figure 4 stretch x30 Supine piriformis stretch x30 Supine LTR x10 Supine deadbugs 10x3 Supine bridge 2x10 TENS premod, bilat lumbar paraspinals to upper glutes x15 min, intensity to pt tolerance with legs elevated  PATIENT EDUCATION:  Education details: HEP, TENS use and placement Person educated: Patient Education method: Verbal, handout, demonstration Education comprehension: Demonstrated  HOME EXERCISE PROGRAM: Access Code: [email or web address] URL: [email or web address] Date: 09/09/2024 Prepared by: Gellen April Earnie Starring  Exercises - Supine Lower  Trunk Rotation  - 1 x daily - 7 x weekly - 1 sets - 10 reps - Supine Piriformis Stretch with Foot on Ground  - 1 x daily - 7 x weekly - 1 sets - 2 reps - 30 sec hold - Supine Figure 4  - 1 x daily - 7 x weekly - 1 sets - 2 reps - 30 sec hold - Isometric Dead Bug  - 1 x daily - 7 x weekly - 1 sets - 10 reps - 3 sec hold - Standing Lumbar Extension with Counter  - 1 x daily - 7 x weekly - 1 sets - 10 reps - 3 sec hold - Standing 'L' Stretch at Counter  - 1 x daily - 7 x weekly - 1 sets - 10 reps - 3 sec hold  ASSESSMENT:  CLINICAL IMPRESSION: Pt arrives for today's treatment session reporting > 5/10 low back pain.Rx focused on Review of HEP and practing movement patterns with ADL's and transitional movements to decrease pain triggers.    OBJECTIVE IMPAIRMENTS: Abnormal gait, decreased activity tolerance, difficulty walking, decreased ROM, increased muscle spasms, postural dysfunction, and pain.   ACTIVITY LIMITATIONS: carrying, lifting, bending, standing, and locomotion level  PARTICIPATION LIMITATIONS: meal prep, cleaning, laundry, occupation, and yard work  PERSONAL FACTORS: Time since onset of injury/illness/exacerbation and 1 comorbidity: 2 lumbar surgeries are also affecting patient's functional outcome.   REHAB POTENTIAL: Good-/Good  CLINICAL DECISION MAKING: Stable/uncomplicated  EVALUATION COMPLEXITY: Low   GOALS:   LONG TERM GOALS: Target date: 10/15/24  Ind with a HEP.  Goal status: INITIAL  2.  Stand 20 minutes with pain not > 4/10.  Goal status: INITIAL  3.  Walk in upright posture.  Goal status: INITIAL  4.  Perform ADL's with pain not >  4/10.  Goal status: INITIAL  5.  Improve ODI score by at least 6-7 points.  Goal status: INITIAL  PLAN:  PT FREQUENCY: 2x/week  PT DURATION: 6 weeks  PLANNED INTERVENTIONS: 97110-Therapeutic exercises, 97530- Therapeutic activity, [id number]- Neuromuscular re-education, 97535- Self Care, 02859- Manual therapy, G0283-  Electrical stimulation (unattended), 97035- Ultrasound, Patient/Family education, and Moist heat.  PLAN FOR NEXT SESSION: Modalities and STW/M as needed.  Core exercise progression.  Body mechanics training and spinal protection techniques per lumbar fusion protocol.       Shaniqua Guillot,CHRIS, PTA, DPT 09/16/2024, 5:29 PM  "

## 2024-09-22 ENCOUNTER — Other Ambulatory Visit

## 2024-09-23 ENCOUNTER — Ambulatory Visit: Admitting: *Deleted

## 2024-09-30 ENCOUNTER — Encounter: Payer: Self-pay | Admitting: *Deleted

## 2024-09-30 ENCOUNTER — Ambulatory Visit: Payer: Self-pay | Attending: Family Medicine | Admitting: *Deleted

## 2024-09-30 DIAGNOSIS — M62830 Muscle spasm of back: Secondary | ICD-10-CM | POA: Diagnosis present

## 2024-09-30 DIAGNOSIS — M5459 Other low back pain: Secondary | ICD-10-CM | POA: Diagnosis present

## 2024-09-30 DIAGNOSIS — R293 Abnormal posture: Secondary | ICD-10-CM | POA: Diagnosis present

## 2024-09-30 NOTE — Therapy (Signed)
 " OUTPATIENT PHYSICAL THERAPY TREATMENT   Patient Name: Greg Mason MRN: [id number] DOB:1971-07-20, 54 y.o., male Today's Date: 09/30/2024  END OF SESSION:  PT End of Session - 09/30/24 1604     Visit Number 5    Number of Visits 12    Date for Recertification  10/15/24    PT Start Time 1600    PT Stop Time 1648    PT Time Calculation (min) 48 min           Past Medical History:  Diagnosis Date   Arthritis    Headache    Migraines years ago   Hyperlipidemia    Pneumonia    Past Surgical History:  Procedure Laterality Date   ANKLE SURGERY Left    Plate placed after fracture   APPENDECTOMY     As a child   KNEE ARTHROPLASTY Left    [facility]   UMBILICAL HERNIA REPAIR  2017     REFERRING PROVIDER: Oneil Severin FNP.  REFERRING DIAG: Back pain with h/o spinal surgery.    Rationale for Evaluation and Treatment: Rehabilitation  THERAPY DIAG:  Other low back pain  Muscle spasm of back  Abnormal posture  ONSET DATE: Ongoing, surgery 01/24/24 L5-S1 fusion and L3-4 decompression and revision on 06/25/24.    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  C/o numbness in hand beginning to develop.  PAIN:  Are you having pain? Yes: NPRS scale: 7-8/10.   Pain location: LB Pain description: As above.   Aggravating factors: More work activity. Relieving factors: As above.    PRECAUTIONS: Other: Per lumbar fusion protocol.    RED FLAGS: None   WEIGHT BEARING RESTRICTIONS: No  FALLS:  Has patient fallen in last 6 months? No  LIVING ENVIRONMENT: Lives in: House/apartment Has following equipment at home: None  PLOF: Independent  PATIENT GOALS: Normalcy would be  awesome.    OBJECTIVE:   PATIENT SURVEYS:  ODI:  21/50 (42%).     POSTURE: rounded shoulders, forward head, decreased lumbar lordosis, and flexed trunk   PALPATION: Tender to palpation on either side of his lumbar incisional site with a great deal of muscle tone.     LOWER EXTREMITY ROM:    Bilateral hip flexion assessed in supine is 105 degrees.  LOWER EXTREMITY MMT:  Normal bilateral knee and ankle strength.  GAIT: Antalgic gait pattern with patient walking in a flexed trunk posture.  TREATMENT DATE:   09-30-24 Nustep x 10 mins L4   Discussed/reviewed AB bracing with transitional movements and ADL's practiced and movement patterns to decrease pain triggers Bridge x 10 hold 3-5 secs  Dying bug x 6 each side hold 10 secs Discussed cat/camel,but unable to get on LT knee US  combo x 8 mins to Bil LB paras 1.5 w/cm2  LT SL Manual STW to bil LB paras  Add standing bird dog  09/11/24                                  EXERCISE LOG  Exercise Repetitions and Resistance Comments  Nustep Lvl 1 x 15 mins   Butterflies Blue XTS 3 sets of 10 reps   Extensions Blue XTS 3 sets of 10 reps   Cybex Lumbar Flexion 60# 3 sets of 10 reps   Cybex Lumbar Extension 60# 3 sets of 10 reps    Seated Chest Press 70# 3 sets of 10 reps     Blank cell = exercise not performed today   Modalities  Date:  Unattended Estim: Lumbar, IFC 80-150 Hz, 10 mins, Pain and Tone    09/09/24 Nustep L1 x 15 min LEs/UEs Standing lumbar ext against counter x10 Standing L stretch at counter x10 Standing gastroc stretch 2x30 Supine feet on green pball knee flex/ext 2x10 Supine figure 4 stretch x30 Supine piriformis stretch x30 Supine LTR x10 Supine deadbugs 10x3 Supine bridge 2x10 TENS premod, bilat lumbar paraspinals to upper glutes x15 min, intensity to pt tolerance with legs elevated  PATIENT EDUCATION:  Education details: HEP, TENS use and placement Person educated: Patient Education method:  Verbal, handout, demonstration Education comprehension: Demonstrated  HOME EXERCISE PROGRAM: Access Code: [email or web address] URL: [email or web address] Date: 09/09/2024 Prepared by: Gellen April Earnie Starring  Exercises - Supine Lower Trunk Rotation  - 1 x daily - 7 x weekly - 1 sets - 10 reps - Supine Piriformis Stretch with Foot on Ground  - 1 x daily - 7 x weekly - 1 sets - 2 reps - 30 sec hold - Supine Figure 4  - 1 x daily - 7 x weekly - 1 sets - 2 reps - 30 sec hold - Isometric Dead Bug  - 1 x daily - 7 x weekly - 1 sets - 10 reps - 3 sec hold - Standing Lumbar Extension with Counter  - 1 x daily - 7 x weekly - 1 sets - 10 reps - 3 sec hold - Standing 'L' Stretch at Counter  - 1 x daily - 7 x weekly - 1 sets - 10 reps - 3 sec hold  ASSESSMENT:  CLINICAL IMPRESSION: Pt arrives for today's treatment session reporting 7/10 low back pain.Rx focused on Review of HEP and practing movement patterns with ADL's and transitional movements to decrease pain triggers with added core exs, US  combo and STW to LB.Pt responded with decreased pain.    OBJECTIVE IMPAIRMENTS: Abnormal gait, decreased activity tolerance, difficulty walking, decreased ROM, increased muscle spasms, postural dysfunction, and pain.   ACTIVITY LIMITATIONS: carrying, lifting, bending, standing, and locomotion level  PARTICIPATION LIMITATIONS: meal prep, cleaning, laundry, occupation, and yard work  PERSONAL FACTORS: Time since onset of injury/illness/exacerbation and 1 comorbidity: 2 lumbar surgeries are also affecting patient's functional outcome.   REHAB POTENTIAL: Good-/Good  CLINICAL DECISION MAKING: Stable/uncomplicated  EVALUATION COMPLEXITY: Low   GOALS:   LONG TERM GOALS: Target date: 10/15/24  Ind with a HEP.  Goal status: INITIAL  2.  Stand 20 minutes with pain not > 4/10.  Goal status: INITIAL  3.  Walk in upright posture.  Goal status: INITIAL  4.  Perform ADL's with pain not > 4/10.   Goal status: INITIAL  5.  Improve ODI score by at least 6-7 points.  Goal status: INITIAL  PLAN:  PT FREQUENCY: 2x/week  PT DURATION: 6 weeks  PLANNED INTERVENTIONS: 97110-Therapeutic exercises, 97530- Therapeutic activity, [id number]- Neuromuscular re-education, 97535- Self Care, 02859- Manual therapy, G0283- Electrical stimulation (unattended), 97035- Ultrasound, Patient/Family education, and Moist heat.  PLAN FOR NEXT SESSION: Modalities and STW/M as needed.  Core exercise progression.  Body mechanics training and spinal protection techniques per lumbar fusion protocol.       Sunshyne Horvath,CHRIS, PTA,  09/30/2024, 6:11 PM  "

## 2024-10-02 ENCOUNTER — Ambulatory Visit: Payer: Self-pay | Admitting: *Deleted

## 2024-10-07 ENCOUNTER — Ambulatory Visit: Payer: Self-pay | Admitting: *Deleted

## 2024-10-09 ENCOUNTER — Ambulatory Visit: Payer: Self-pay | Admitting: Physical Therapy

## 2024-10-14 ENCOUNTER — Ambulatory Visit: Payer: Self-pay | Admitting: *Deleted

## 2024-10-15 ENCOUNTER — Ambulatory Visit: Admitting: Family Medicine

## 2024-10-15 ENCOUNTER — Encounter: Payer: Self-pay | Admitting: Family Medicine

## 2024-10-15 VITALS — BP 122/74 | HR 71 | Temp 98.2°F | Ht 72.0 in | Wt 196.0 lb

## 2024-10-15 DIAGNOSIS — M25649 Stiffness of unspecified hand, not elsewhere classified: Secondary | ICD-10-CM

## 2024-10-15 DIAGNOSIS — S81012D Laceration without foreign body, left knee, subsequent encounter: Secondary | ICD-10-CM

## 2024-10-15 DIAGNOSIS — Z9889 Other specified postprocedural states: Secondary | ICD-10-CM

## 2024-10-15 DIAGNOSIS — F419 Anxiety disorder, unspecified: Secondary | ICD-10-CM

## 2024-10-15 DIAGNOSIS — M79642 Pain in left hand: Secondary | ICD-10-CM

## 2024-10-15 DIAGNOSIS — M549 Dorsalgia, unspecified: Secondary | ICD-10-CM

## 2024-10-15 DIAGNOSIS — M79641 Pain in right hand: Secondary | ICD-10-CM

## 2024-10-15 MED ORDER — DULOXETINE HCL 60 MG PO CPEP: 60.0000 mg | ORAL_CAPSULE | Freq: Every day | ORAL | 2 refills | Status: AC

## 2024-10-15 MED ORDER — GABAPENTIN 100 MG PO CAPS: 100.0000 mg | ORAL_CAPSULE | Freq: Three times a day (TID) | ORAL | 2 refills | Status: AC
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
## 2024-10-15 NOTE — Patient Instructions (Signed)
 If your symptoms worsen or you have thoughts of suicide/homicide, PLEASE SEEK IMMEDIATE MEDICAL ATTENTION.  You may always call the National Suicide Hotline.  This is available 24 hours a day, 7 days a week.  Their number is: 860 108 4233  Taking the medicine as directed and not missing any doses is one of the best things you can do to treat your depression.  Here are some things to keep in mind:  Side effects (stomach upset, some increased anxiety) may happen before you notice a benefit.  These side effects typically go away over time. Changes to your dose of medicine or a change in medication all together is sometimes necessary Most people need to be on medication at least 6 months Many people will notice an improvement within two weeks but the full effect of the medication can take up to 4-6 weeks Stopping the medication when you start feeling better often results in a return of symptoms Never discontinue your medication without contacting a health care professional first.  Some medications require gradual discontinuation/ taper and can make you sick if you stop them abruptly.  If your symptoms worsen or you have thoughts of suicide/homicide, PLEASE SEEK IMMEDIATE MEDICAL ATTENTION.  You may always call:  National Suicide Hotline: 475 713 4818 [place] Crisis Line: 915-221-4757 Crisis Recovery in [place]: (276)833-9095  These are available 24 hours a day, 7 days a week.

## 2024-10-15 NOTE — Progress Notes (Deleted)
 Greg Mason

## 2024-10-16 ENCOUNTER — Telehealth: Payer: Self-pay | Admitting: Family Medicine

## 2024-10-16 NOTE — Telephone Encounter (Signed)
 Both Rx's were sent to the pharmacy yesterday.   Copied from CRM #[id number]. Topic: Clinical - Prescription Issue >> Oct 15, 2024  5:44 PM Greg Mason wrote: Reason for CRM:   Patient states provider was going to send in Gabapentin  & Cymbalta  after today's visit and pharmacy has not received anything. Please advise # [phone] Patient very upset, fyi.

## 2024-10-21 ENCOUNTER — Ambulatory Visit: Admitting: Family Medicine

## 2024-10-24 ENCOUNTER — Emergency Department (HOSPITAL_COMMUNITY)

## 2024-10-24 ENCOUNTER — Emergency Department (HOSPITAL_COMMUNITY)
Admission: EM | Admit: 2024-10-24 | Discharge: 2024-10-24 | Disposition: A | Discharge status: Home / Self Care | Attending: Emergency Medicine | Admitting: Emergency Medicine

## 2024-10-24 DIAGNOSIS — M7989 Other specified soft tissue disorders: Secondary | ICD-10-CM | POA: Diagnosis not present

## 2024-10-24 DIAGNOSIS — M79642 Pain in left hand: Secondary | ICD-10-CM | POA: Diagnosis present

## 2024-10-24 DIAGNOSIS — W000XXA Fall on same level due to ice and snow, initial encounter: Secondary | ICD-10-CM | POA: Diagnosis not present

## 2024-10-24 DIAGNOSIS — Z4802 Encounter for removal of sutures: Secondary | ICD-10-CM | POA: Diagnosis not present

## 2024-10-24 MED ORDER — TRIPLE ANTIBIOTIC 3.5-400-5000 EX OINT
TOPICAL_OINTMENT | Freq: Once | CUTANEOUS | Status: AC
  Administered 2024-10-24: 1 via CUTANEOUS
  Filled 2024-10-24: qty 1

## 2024-10-24 NOTE — Discharge Instructions (Addendum)
 X-rays did not show any fractures, it was concerning for swelling and arthritis in your hand.  15 sutures were removed from your left knee.  It is recommended that you follow-up with your primary care this week for further evaluation of hand and suture site.  If you notice any signs of infection of the left knee please return to the emergency department for further evaluation.  If you notice any concerning new or worsening symptoms of your hand as discussed in the emergency department please return for further evaluation.

## 2024-10-24 NOTE — ED Triage Notes (Signed)
 Pt comes in for left hand injury after a fall from yesterday. Pt is asking for sutures to be removed from left knee.   Swelling in left hand is noted in triage. Pt is is unable to move hand. A&Ox4.

## 2024-10-24 NOTE — ED Provider Notes (Signed)
 " [place] EMERGENCY DEPARTMENT AT [facility] Provider Note   CSN: [id number] Arrival date & time: 10/24/24  1142     Patient presents with: Hand Injury (left) and Suture / Staple Removal   Greg Mason is a 53 y.o. male.  54 year old male presents emergency department with complaints of 3 falls yesterday where he caught himself with his left hand.  Patient reports slipping on the ice while at work and catching his fall with his left hand and reports not hitting any other portions of his body.  Patient denies any other injuries.  Patient denies any head injuries, dizziness, syncope, or blood thinners.  Patient reports all of these were mechanical secondary to ice.  Patient reports he has noted some swelling in his left hand worsening today.  Patient reports he still able to move it and has good feeling throughout but was concern for fracture.  Patient also reports he had an injury with a chainsaw on his left knee about 2 weeks ago where he was sutured at another facility.  He is requesting the sutures to be removed at this time.     Prior to Admission medications  Medication Sig Start Date End Date Taking? Authorizing Provider  amoxicillin-clavulanate (AUGMENTIN) 875-125 MG tablet Take 1 tablet by mouth 2 (two) times daily.    [provider]  diazepam  (VALIUM ) 5 MG tablet Take 1 tablet (5 mg total) by mouth every 6 (six) hours as needed for muscle spasms. 01/25/24   Gillie Duncans, MD  DULoxetine  (CYMBALTA ) 60 MG capsule Take 1 capsule (60 mg total) by mouth daily. 10/15/24   Alcus Oneil ORN, FNP  gabapentin  (NEURONTIN ) 100 MG capsule Take 1 capsule (100 mg total) by mouth 3 (three) times daily. 10/15/24   Alcus Oneil ORN, FNP  sildenafil  (VIAGRA ) 100 MG tablet Take 1 tablet (100 mg total) by mouth daily as needed for erectile dysfunction. 08/15/24   Alcus Oneil ORN, FNP    Allergies: Patient has no known allergies.    Review of Systems  Musculoskeletal:  Positive for  arthralgias and myalgias.  Skin:  Positive for wound.  All other systems reviewed and are negative.   Updated Vital Signs BP 135/77 (BP Location: Right Arm)   Pulse 66   Temp 98 F (36.7 C) (Oral)   Resp 16   SpO2 97%   Physical Exam Vitals and nursing note reviewed.  Constitutional:      General: He is not in acute distress.    Appearance: Normal appearance. He is not ill-appearing or toxic-appearing.  HENT:     Head: Normocephalic and atraumatic.     Nose: Nose normal.  Eyes:     Extraocular Movements: Extraocular movements intact.     Conjunctiva/sclera: Conjunctivae normal.     Pupils: Pupils are equal, round, and reactive to light.  Cardiovascular:     Rate and Rhythm: Normal rate.  Pulmonary:     Effort: Pulmonary effort is normal. No respiratory distress.  Musculoskeletal:        General: Swelling present. Normal range of motion.     Cervical back: Normal range of motion.     Comments: Patient has swelling on the dorsal portion of his left hand.  No deformities.  Full sensation throughout.  Strong pulses, less than 2-second cap refill, and full range of motion.\  Healing wound on his left anterior knee with no signs of infection.  Skin:    General: Skin is warm.  Capillary Refill: Capillary refill takes less than 2 seconds.     Findings: No bruising.  Neurological:     General: No focal deficit present.     Mental Status: He is alert.  Psychiatric:        Mood and Affect: Mood normal.        Behavior: Behavior normal.     (all labs ordered are listed, but only abnormal results are displayed) Labs Reviewed - No data to display  EKG: None  Radiology: DG Hand Complete Left Result Date: 10/24/2024 CLINICAL DATA:  Left hand injury after fall.  Pain. EXAM: LEFT HAND - COMPLETE 3+ VIEW COMPARISON:  Left hand radiographs dated 11/24/2023. FINDINGS: No acute fracture or dislocation. Remote healed fracture deformity of the fifth metacarpal. Radiocarpal joint  space narrowing. Severe osteoarthritis of the first [facility] joint. Mild osteoarthritis of the triscaphe articulation. Mild osteoarthritis of the first, second, and third MCP joints. Dorsal soft tissue swelling of the hand. No radiopaque foreign body. IMPRESSION: 1. No acute osseous abnormality. 2. Dorsal soft tissue swelling of the hand. 3. Severe osteoarthritis of the first [facility] joint. Mild osteoarthritis elsewhere throughout the hand, as above. Electronically Signed   By: Harrietta Sherry M.D.   On: 10/24/2024 13:48    Procedures   Medications Ordered in the ED  neomycin -bacitracin -polymyxin [phone] OINT (1 Application Apply externally Given 10/24/24 1726)    54 y.o. male presents to the ED with complaints of left hand pain and swelling,   The differential diagnosis includes fracture, soft tissue swelling, gout, infection, retained foreign body, (Ddx)  On arrival pt is nontoxic, vitals unremarkable. Exam significant for left hand swelling appropriately healing left knee laceration  Additional history obtained from chart review significant patient being sutured in ED approximately 13 days ago with reported 16 sutures placed in the anterior left knee.   Imaging Studies ordered:  Hand x-ray ordered in triage showed no acute osseous abnormality.  There was some dorsal soft tissue swelling of the hand and severe osteoarthritis of the first [facility] joint mild osteoarthritis elsewhere noted on x-ray report.   ED Course:   Patient appears in no distress.  No fractures noted on x-ray.  Patient does have some swelling in his left hand no obvious deformities and good sensation throughout.  Patient still has full range of motion.  I suspect this is secondary to soft tissue swelling from repetitive trauma to the hand yesterday.  15 sutures were removed from the left knee.  ED note from 13 days ago reports there was 16 sutures placed.  Through visualization of the wound only 15 were found.  There was some  significant scabbing throughout, loose scabs were removed to try to visualize the last suture.  Patient reports he has a follow-up with his PCP this week and he would have him reassessed at this time.  The wound does not look infected and looks like it is healing appropriately.  Patient was advised of strict return precautions and symptoms to monitor for.  Patient does have PCP follow-up this week already scheduled.  Patient agreed to treatment plan and was comfortable with discharge at this time.   Portions of this note were generated with Scientist, clinical (histocompatibility and immunogenetics). Dictation errors may occur despite best attempts at proofreading.   Final diagnoses:  Hand pain, left    ED Discharge Orders     None          Myriam Fonda RAMAN, [place] 10/24/24 2343  "

## 2024-10-28 ENCOUNTER — Ambulatory Visit: Admitting: Family Medicine

## 2024-11-13 ENCOUNTER — Ambulatory Visit: Admitting: Family Medicine
